# Patient Record
Sex: Female | Born: 1960
Health system: Southern US, Community
[De-identification: ages and names within clinical notes are randomized; demographics above are authoritative.]

## PROBLEM LIST (undated history)

## (undated) DIAGNOSIS — I10 Essential (primary) hypertension: Secondary | ICD-10-CM

## (undated) DIAGNOSIS — Z789 Other specified health status: Secondary | ICD-10-CM

## (undated) DIAGNOSIS — M21619 Bunion of unspecified foot: Secondary | ICD-10-CM

## (undated) HISTORY — PX: NO PAST SURGERIES: SHX2092

## (undated) HISTORY — PX: COLONOSCOPY: SHX174

---

## 1997-09-09 ENCOUNTER — Other Ambulatory Visit: Admission: RE | Admit: 1997-09-09 | Discharge: 1997-09-09 | Payer: Self-pay | Admitting: Obstetrics and Gynecology

## 1998-10-05 ENCOUNTER — Other Ambulatory Visit: Admission: RE | Admit: 1998-10-05 | Discharge: 1998-10-05 | Payer: Self-pay | Admitting: Obstetrics and Gynecology

## 1999-10-19 ENCOUNTER — Other Ambulatory Visit: Admission: RE | Admit: 1999-10-19 | Discharge: 1999-10-19 | Payer: Self-pay | Admitting: Obstetrics and Gynecology

## 2000-10-25 ENCOUNTER — Other Ambulatory Visit: Admission: RE | Admit: 2000-10-25 | Discharge: 2000-10-25 | Payer: Self-pay | Admitting: Obstetrics and Gynecology

## 2001-11-08 ENCOUNTER — Other Ambulatory Visit: Admission: RE | Admit: 2001-11-08 | Discharge: 2001-11-08 | Payer: Self-pay | Admitting: Obstetrics and Gynecology

## 2002-11-25 ENCOUNTER — Other Ambulatory Visit: Admission: RE | Admit: 2002-11-25 | Discharge: 2002-11-25 | Payer: Self-pay | Admitting: Obstetrics and Gynecology

## 2003-12-02 ENCOUNTER — Other Ambulatory Visit: Admission: RE | Admit: 2003-12-02 | Discharge: 2003-12-02 | Payer: Self-pay | Admitting: Obstetrics and Gynecology

## 2004-01-08 ENCOUNTER — Emergency Department (HOSPITAL_COMMUNITY): Admission: EM | Admit: 2004-01-08 | Discharge: 2004-01-08 | Payer: Self-pay | Admitting: Emergency Medicine

## 2005-01-17 ENCOUNTER — Other Ambulatory Visit: Admission: RE | Admit: 2005-01-17 | Discharge: 2005-01-17 | Payer: Self-pay | Admitting: Obstetrics and Gynecology

## 2011-03-04 ENCOUNTER — Encounter: Payer: Self-pay | Admitting: Internal Medicine

## 2011-03-22 ENCOUNTER — Encounter: Payer: Self-pay | Admitting: Internal Medicine

## 2011-03-22 ENCOUNTER — Ambulatory Visit (AMBULATORY_SURGERY_CENTER): Payer: BC Managed Care – PPO

## 2011-03-22 VITALS — Ht 62.0 in | Wt 146.8 lb

## 2011-03-22 DIAGNOSIS — Z1211 Encounter for screening for malignant neoplasm of colon: Secondary | ICD-10-CM

## 2011-03-22 MED ORDER — PEG-KCL-NACL-NASULF-NA ASC-C 100 G PO SOLR: 1.0000 | Freq: Once | ORAL | Status: AC

## 2011-03-22 MED ORDER — PEG-KCL-NACL-NASULF-NA ASC-C 100 G PO SOLR: 1.0000 | Freq: Once | ORAL | Status: DC

## 2011-04-04 ENCOUNTER — Other Ambulatory Visit: Payer: Self-pay | Admitting: Internal Medicine

## 2011-05-10 ENCOUNTER — Other Ambulatory Visit: Payer: Self-pay | Admitting: Internal Medicine

## 2011-05-16 ENCOUNTER — Other Ambulatory Visit: Payer: BC Managed Care – PPO | Admitting: Internal Medicine

## 2011-06-24 ENCOUNTER — Encounter: Payer: Self-pay | Admitting: Internal Medicine

## 2011-06-24 ENCOUNTER — Ambulatory Visit (AMBULATORY_SURGERY_CENTER): Payer: BC Managed Care – PPO | Admitting: *Deleted

## 2011-06-24 VITALS — Ht 62.0 in | Wt 141.0 lb

## 2011-06-24 DIAGNOSIS — Z1211 Encounter for screening for malignant neoplasm of colon: Secondary | ICD-10-CM

## 2011-06-24 MED ORDER — PEG-KCL-NACL-NASULF-NA ASC-C 100 G PO SOLR: ORAL | Status: DC

## 2011-07-08 ENCOUNTER — Other Ambulatory Visit: Payer: BC Managed Care – PPO | Admitting: Internal Medicine

## 2012-03-27 ENCOUNTER — Encounter: Payer: Self-pay | Admitting: Internal Medicine

## 2012-05-02 ENCOUNTER — Encounter: Payer: Self-pay | Admitting: Internal Medicine

## 2012-05-02 ENCOUNTER — Ambulatory Visit (AMBULATORY_SURGERY_CENTER): Payer: BC Managed Care – PPO

## 2012-05-02 VITALS — Ht 62.0 in | Wt 145.0 lb

## 2012-05-02 DIAGNOSIS — Z1211 Encounter for screening for malignant neoplasm of colon: Secondary | ICD-10-CM

## 2012-05-02 MED ORDER — NA SULFATE-K SULFATE-MG SULF 17.5-3.13-1.6 GM/177ML PO SOLN: 1.0000 | Freq: Once | ORAL | Status: DC

## 2012-05-15 ENCOUNTER — Telehealth: Payer: Self-pay | Admitting: *Deleted

## 2012-05-15 NOTE — Telephone Encounter (Signed)
Call made in error

## 2012-05-16 ENCOUNTER — Ambulatory Visit (AMBULATORY_SURGERY_CENTER): Payer: BC Managed Care – PPO | Admitting: Internal Medicine

## 2012-05-16 ENCOUNTER — Encounter: Payer: Self-pay | Admitting: Internal Medicine

## 2012-05-16 VITALS — BP 120/75 | HR 73 | Temp 98.5°F | Resp 20 | Ht 62.0 in | Wt 145.0 lb

## 2012-05-16 DIAGNOSIS — Z1211 Encounter for screening for malignant neoplasm of colon: Secondary | ICD-10-CM

## 2012-05-16 DIAGNOSIS — D126 Benign neoplasm of colon, unspecified: Secondary | ICD-10-CM

## 2012-05-16 MED ORDER — FLUTICASONE PROPIONATE 50 MCG/ACT NA SUSP: 2.0000 | NASAL | Status: DC | PRN

## 2012-05-16 MED ORDER — SODIUM CHLORIDE 0.9 % IV SOLN: 500.0000 mL | INTRAVENOUS | Status: DC

## 2012-05-16 NOTE — Op Note (Signed)
[facility] [address] [place], [address]   COLONOSCOPY PROCEDURE REPORT  PATIENT: Ashley Marks, Ashley Marks  MR#: [id number] BIRTHDATE: [birth year] , 51  yrs. old GENDER: Female ENDOSCOPIST: Iva Boop, MD, [facility] REFERRED XB:JYNW Waynard Edwards, M.D. PROCEDURE DATE:  05/16/2012 PROCEDURE:   Colonoscopy with biopsy ASA CLASS:   Class I INDICATIONS:average risk screening. MEDICATIONS: propofol (Diprivan) 300mg  IV, MAC sedation, administered by CRNA, and These medications were titrated to patient response per physician's verbal order  DESCRIPTION OF PROCEDURE:   After the risks benefits and alternatives of the procedure were thoroughly explained, informed consent was obtained.  A digital rectal exam revealed no abnormalities of the rectum.   The LB [id number]-[id number]  endoscope was introduced through the anus and advanced to the cecum, which was identified by both the appendix and ileocecal valve. No adverse events experienced.   The quality of the prep was Suprep excellent The instrument was then slowly withdrawn as the colon was fully examined.      COLON FINDINGS: A smooth sessile polyp measuring 2 mm in size was found in the transverse colon.  A polypectomy was performed with cold forceps.  The resection was complete and the polyp tissue was completely retrieved.   The colon mucosa was otherwise normal.   A right colon retroflexion was performed.  Retroflexed views revealed no abnormalities. The time to cecum=5 minutes 10 seconds. Withdrawal time=9 minutes 54 seconds.  The scope was withdrawn and the procedure completed. COMPLICATIONS: There were no complications.  ENDOSCOPIC IMPRESSION: 1.   Sessile polyp measuring 2 mm in size was found in the transverse colon; polypectomy was performed with cold forceps 2.   The colon mucosa was otherwise normal - excellent prep  RECOMMENDATIONS: Timing of repeat colonoscopy will be determined by  pathology findings.   eSigned:  Iva Boop, MD, [facility] 05/16/2012 11:14 AM   cc: Rodrigo Ran, MD and The Patient

## 2012-05-16 NOTE — Patient Instructions (Addendum)
One very tiny polyp was removed. It should be benign. Everything else looked fine.  I will let you know pathology results and when to have another routine colonoscopy by mail. Dr. Waynard Edwards will also get the results. Thank you for choosing me and [facility].  Iva Boop, MD, FACG   YOU HAD AN ENDOSCOPIC PROCEDURE TODAY AT THE  ENDOSCOPY CENTER: Refer to the procedure report that was given to you for any specific questions about what was found during the examination.  If the procedure report does not answer your questions, please call your gastroenterologist to clarify.  If you requested that your care partner not be given the details of your procedure findings, then the procedure report has been included in a sealed envelope for you to review at your convenience later.  YOU SHOULD EXPECT: Some feelings of bloating in the abdomen. Passage of more gas than usual.  Walking can help get rid of the air that was put into your GI tract during the procedure and reduce the bloating. If you had a lower endoscopy (such as a colonoscopy or flexible sigmoidoscopy) you may notice spotting of blood in your stool or on the toilet paper. If you underwent a bowel prep for your procedure, then you may not have a normal bowel movement for a few days.  DIET: Your first meal following the procedure should be a light meal and then it is ok to progress to your normal diet.  A half-sandwich or bowl of soup is an example of a good first meal.  Heavy or fried foods are harder to digest and may make you feel nauseous or bloated.  Likewise meals heavy in dairy and vegetables can cause extra gas to form and this can also increase the bloating.  Drink plenty of fluids but you should avoid alcoholic beverages for 24 hours.  ACTIVITY: Your care partner should take you home directly after the procedure.  You should plan to take it easy, moving slowly for the rest of the day.  You can resume normal activity the  day after the procedure however you should NOT DRIVE or use heavy machinery for 24 hours (because of the sedation medicines used during the test).    SYMPTOMS TO REPORT IMMEDIATELY: A gastroenterologist can be reached at any hour.  During normal business hours, 8:30 AM to 5:00 PM Monday through Friday, call [phone].  After hours and on weekends, please call the GI answering service at [phone] who will take a message and have the physician on call contact you.   Following lower endoscopy (colonoscopy or flexible sigmoidoscopy):  Excessive amounts of blood in the stool  Significant tenderness or worsening of abdominal pains  Swelling of the abdomen that is new, acute  Fever of 100F or higher  FOLLOW UP: If any biopsies were taken you will be contacted by phone or by letter within the next 1-3 weeks.  Call your gastroenterologist if you have not heard about the biopsies in 3 weeks.  Our staff will call the home number listed on your records the next business day following your procedure to check on you and address any questions or concerns that you may have at that time regarding the information given to you following your procedure. This is a courtesy call and so if there is no answer at the home number and we have not heard from you through the emergency physician on call, we will assume that you have returned to your regular  daily activities without incident.  SIGNATURES/CONFIDENTIALITY: You and/or your care partner have signed paperwork which will be entered into your electronic medical record.  These signatures attest to the fact that that the information above on your After Visit Summary has been reviewed and is understood.  Full responsibility of the confidentiality of this discharge information lies with you and/or your care-partner.  Polyp-handout given  Repeat colonoscopy will be determined by pathology

## 2012-05-16 NOTE — Progress Notes (Signed)
Patient did not experience any of the following events: a burn prior to discharge; a fall within the facility; wrong site/side/patient/procedure/implant event; or a hospital transfer or hospital admission upon discharge from the facility. (G8907) Patient did not have preoperative order for IV antibiotic SSI prophylaxis. (G8918)  

## 2012-05-17 ENCOUNTER — Telehealth: Payer: Self-pay

## 2012-05-17 NOTE — Telephone Encounter (Signed)
  Follow up Call-  Call back number 05/16/2012  Post procedure Call Back phone  # [phone]  Permission to leave phone message Yes     Patient questions:  Do you have a fever, pain , or abdominal swelling? no Pain Score  0 *  Have you tolerated food without any problems? yes  Have you been able to return to your normal activities? yes  Do you have any questions about your discharge instructions: Diet   no Medications  no Follow up visit  no  Do you have questions or concerns about your Care? no  Actions: * If pain score is 4 or above: No action needed, pain <4.

## 2012-05-23 ENCOUNTER — Encounter: Payer: Self-pay | Admitting: Internal Medicine

## 2014-05-29 ENCOUNTER — Other Ambulatory Visit: Payer: Self-pay | Admitting: Obstetrics and Gynecology

## 2014-06-02 LAB — CYTOLOGY - PAP

## 2015-06-19 ENCOUNTER — Other Ambulatory Visit: Payer: Self-pay | Admitting: Obstetrics and Gynecology

## 2015-06-19 DIAGNOSIS — R928 Other abnormal and inconclusive findings on diagnostic imaging of breast: Secondary | ICD-10-CM

## 2015-06-29 ENCOUNTER — Ambulatory Visit
Admission: RE | Admit: 2015-06-29 | Discharge: 2015-06-29 | Disposition: A | Discharge status: Home / Self Care | Payer: BLUE CROSS/BLUE SHIELD | Source: Ambulatory Visit | Attending: Obstetrics and Gynecology | Admitting: Obstetrics and Gynecology

## 2015-06-29 ENCOUNTER — Other Ambulatory Visit: Payer: Self-pay | Admitting: Obstetrics and Gynecology

## 2015-06-29 DIAGNOSIS — R921 Mammographic calcification found on diagnostic imaging of breast: Secondary | ICD-10-CM

## 2015-06-29 DIAGNOSIS — R928 Other abnormal and inconclusive findings on diagnostic imaging of breast: Secondary | ICD-10-CM

## 2015-07-03 ENCOUNTER — Ambulatory Visit
Admission: RE | Admit: 2015-07-03 | Discharge: 2015-07-03 | Disposition: A | Discharge status: Home / Self Care | Payer: BLUE CROSS/BLUE SHIELD | Source: Ambulatory Visit | Attending: Obstetrics and Gynecology | Admitting: Obstetrics and Gynecology

## 2015-07-03 ENCOUNTER — Other Ambulatory Visit: Payer: Self-pay | Admitting: Obstetrics and Gynecology

## 2015-07-03 DIAGNOSIS — R921 Mammographic calcification found on diagnostic imaging of breast: Secondary | ICD-10-CM

## 2015-07-16 ENCOUNTER — Other Ambulatory Visit: Payer: Self-pay | Admitting: General Surgery

## 2015-07-16 DIAGNOSIS — R928 Other abnormal and inconclusive findings on diagnostic imaging of breast: Secondary | ICD-10-CM

## 2015-07-28 ENCOUNTER — Other Ambulatory Visit: Payer: Self-pay | Admitting: General Surgery

## 2015-07-28 DIAGNOSIS — R928 Other abnormal and inconclusive findings on diagnostic imaging of breast: Secondary | ICD-10-CM

## 2015-07-29 ENCOUNTER — Encounter (HOSPITAL_BASED_OUTPATIENT_CLINIC_OR_DEPARTMENT_OTHER): Payer: Self-pay | Admitting: *Deleted

## 2015-08-05 ENCOUNTER — Ambulatory Visit
Admission: RE | Admit: 2015-08-05 | Discharge: 2015-08-05 | Disposition: A | Discharge status: Home / Self Care | Payer: BLUE CROSS/BLUE SHIELD | Source: Ambulatory Visit | Attending: General Surgery | Admitting: General Surgery

## 2015-08-05 DIAGNOSIS — R928 Other abnormal and inconclusive findings on diagnostic imaging of breast: Secondary | ICD-10-CM

## 2015-08-05 NOTE — Progress Notes (Signed)
Pt picked up boost drink and gave pt instructions/ verbalized understanding.

## 2015-08-06 ENCOUNTER — Encounter (HOSPITAL_BASED_OUTPATIENT_CLINIC_OR_DEPARTMENT_OTHER)
Admission: RE | Disposition: A | Discharge status: Home / Self Care | Payer: Self-pay | Source: Ambulatory Visit | Attending: General Surgery

## 2015-08-06 ENCOUNTER — Ambulatory Visit (HOSPITAL_BASED_OUTPATIENT_CLINIC_OR_DEPARTMENT_OTHER)
Admission: RE | Admit: 2015-08-06 | Discharge: 2015-08-06 | Disposition: A | Discharge status: Home / Self Care | Payer: BLUE CROSS/BLUE SHIELD | Source: Ambulatory Visit | Attending: General Surgery | Admitting: General Surgery

## 2015-08-06 ENCOUNTER — Ambulatory Visit (HOSPITAL_BASED_OUTPATIENT_CLINIC_OR_DEPARTMENT_OTHER): Payer: BLUE CROSS/BLUE SHIELD | Admitting: Anesthesiology

## 2015-08-06 ENCOUNTER — Encounter (HOSPITAL_BASED_OUTPATIENT_CLINIC_OR_DEPARTMENT_OTHER): Payer: Self-pay

## 2015-08-06 ENCOUNTER — Ambulatory Visit
Admission: RE | Admit: 2015-08-06 | Discharge: 2015-08-06 | Disposition: A | Discharge status: Home / Self Care | Payer: BLUE CROSS/BLUE SHIELD | Source: Ambulatory Visit | Attending: General Surgery | Admitting: General Surgery

## 2015-08-06 DIAGNOSIS — N62 Hypertrophy of breast: Secondary | ICD-10-CM | POA: Insufficient documentation

## 2015-08-06 DIAGNOSIS — Z87891 Personal history of nicotine dependence: Secondary | ICD-10-CM | POA: Diagnosis not present

## 2015-08-06 DIAGNOSIS — R928 Other abnormal and inconclusive findings on diagnostic imaging of breast: Secondary | ICD-10-CM

## 2015-08-06 HISTORY — PX: RADIOACTIVE SEED GUIDED EXCISIONAL BREAST BIOPSY: SHX6490

## 2015-08-06 HISTORY — DX: Other specified health status: Z78.9

## 2015-08-06 SURGERY — RADIOACTIVE SEED GUIDED BREAST BIOPSY
Anesthesia: General | Site: Breast | Laterality: Right

## 2015-08-06 MED ORDER — OXYCODONE-ACETAMINOPHEN 5-325 MG PO TABS: 1.0000 | ORAL_TABLET | ORAL | Status: DC | PRN

## 2015-08-06 MED ORDER — LACTATED RINGERS IV SOLN
INTRAVENOUS | Status: DC
  Administered 2015-08-06 (×2): via INTRAVENOUS

## 2015-08-06 MED ORDER — PROPOFOL 10 MG/ML IV BOLUS
INTRAVENOUS | Status: AC
  Filled 2015-08-06: qty 20

## 2015-08-06 MED ORDER — OXYCODONE HCL 5 MG PO TABS: 5.0000 mg | ORAL_TABLET | Freq: Once | ORAL | Status: DC | PRN

## 2015-08-06 MED ORDER — ATROPINE SULFATE 1 MG/ML IJ SOLN
INTRAMUSCULAR | Status: AC
  Filled 2015-08-06: qty 1

## 2015-08-06 MED ORDER — ONDANSETRON HCL 4 MG/2ML IJ SOLN
INTRAMUSCULAR | Status: DC | PRN
  Administered 2015-08-06: 4 mg via INTRAVENOUS

## 2015-08-06 MED ORDER — FENTANYL CITRATE (PF) 100 MCG/2ML IJ SOLN
50.0000 ug | INTRAMUSCULAR | Status: DC | PRN
  Administered 2015-08-06: 100 ug via INTRAVENOUS

## 2015-08-06 MED ORDER — BUPIVACAINE-EPINEPHRINE 0.5% -1:200000 IJ SOLN
INTRAMUSCULAR | Status: DC | PRN
  Administered 2015-08-06: 20 mL

## 2015-08-06 MED ORDER — SCOPOLAMINE 1 MG/3DAYS TD PT72: 1.0000 | MEDICATED_PATCH | Freq: Once | TRANSDERMAL | Status: DC | PRN

## 2015-08-06 MED ORDER — MIDAZOLAM HCL 2 MG/2ML IJ SOLN
INTRAMUSCULAR | Status: AC
  Filled 2015-08-06: qty 2

## 2015-08-06 MED ORDER — EPHEDRINE 5 MG/ML INJ
INTRAVENOUS | Status: AC
  Filled 2015-08-06: qty 10

## 2015-08-06 MED ORDER — ONDANSETRON HCL 4 MG/2ML IJ SOLN
INTRAMUSCULAR | Status: AC
  Filled 2015-08-06: qty 2

## 2015-08-06 MED ORDER — OXYCODONE HCL 5 MG/5ML PO SOLN: 5.0000 mg | Freq: Once | ORAL | Status: DC | PRN

## 2015-08-06 MED ORDER — FENTANYL CITRATE (PF) 100 MCG/2ML IJ SOLN: 25.0000 ug | INTRAMUSCULAR | Status: DC | PRN

## 2015-08-06 MED ORDER — FENTANYL CITRATE (PF) 100 MCG/2ML IJ SOLN
INTRAMUSCULAR | Status: AC
  Filled 2015-08-06: qty 2

## 2015-08-06 MED ORDER — CEFAZOLIN SODIUM-DEXTROSE 2-4 GM/100ML-% IV SOLN
2.0000 g | INTRAVENOUS | Status: AC
  Administered 2015-08-06: 2 g via INTRAVENOUS

## 2015-08-06 MED ORDER — LIDOCAINE 2% (20 MG/ML) 5 ML SYRINGE
INTRAMUSCULAR | Status: AC
  Filled 2015-08-06: qty 5

## 2015-08-06 MED ORDER — DEXAMETHASONE SODIUM PHOSPHATE 10 MG/ML IJ SOLN
INTRAMUSCULAR | Status: AC
  Filled 2015-08-06: qty 1

## 2015-08-06 MED ORDER — MIDAZOLAM HCL 2 MG/2ML IJ SOLN
1.0000 mg | INTRAMUSCULAR | Status: DC | PRN
  Administered 2015-08-06: 2 mg via INTRAVENOUS

## 2015-08-06 MED ORDER — ONDANSETRON HCL 4 MG/2ML IJ SOLN: 4.0000 mg | Freq: Four times a day (QID) | INTRAMUSCULAR | Status: DC | PRN

## 2015-08-06 MED ORDER — DEXAMETHASONE SODIUM PHOSPHATE 4 MG/ML IJ SOLN
INTRAMUSCULAR | Status: DC | PRN
  Administered 2015-08-06: 10 mg via INTRAVENOUS

## 2015-08-06 MED ORDER — GLYCOPYRROLATE 0.2 MG/ML IJ SOLN: 0.2000 mg | Freq: Once | INTRAMUSCULAR | Status: DC | PRN

## 2015-08-06 MED ORDER — CEFAZOLIN SODIUM-DEXTROSE 2-4 GM/100ML-% IV SOLN
INTRAVENOUS | Status: AC
  Filled 2015-08-06: qty 100

## 2015-08-06 MED ORDER — LIDOCAINE HCL (CARDIAC) 20 MG/ML IV SOLN
INTRAVENOUS | Status: DC | PRN
  Administered 2015-08-06: 30 mg via INTRAVENOUS

## 2015-08-06 MED ORDER — PHENYLEPHRINE 40 MCG/ML (10ML) SYRINGE FOR IV PUSH (FOR BLOOD PRESSURE SUPPORT)
PREFILLED_SYRINGE | INTRAVENOUS | Status: AC
  Filled 2015-08-06: qty 10

## 2015-08-06 MED ORDER — SUCCINYLCHOLINE CHLORIDE 200 MG/10ML IV SOSY
PREFILLED_SYRINGE | INTRAVENOUS | Status: AC
  Filled 2015-08-06: qty 10

## 2015-08-06 MED ORDER — PROPOFOL 10 MG/ML IV BOLUS
INTRAVENOUS | Status: DC | PRN
  Administered 2015-08-06: 250 mg via INTRAVENOUS

## 2015-08-06 SURGICAL SUPPLY — 54 items
ADH SKN CLS APL DERMABOND .7 (GAUZE/BANDAGES/DRESSINGS) ×1
BINDER BREAST LRG (GAUZE/BANDAGES/DRESSINGS) ×1 IMPLANT
BLADE SURG 10 STRL SS (BLADE) IMPLANT
BLADE SURG 15 STRL LF DISP TIS (BLADE) ×1 IMPLANT
BLADE SURG 15 STRL SS (BLADE) ×2
CANISTER SUC SOCK COL 7IN (MISCELLANEOUS) IMPLANT
CANISTER SUCT 1200ML W/VALVE (MISCELLANEOUS) ×2 IMPLANT
CHLORAPREP W/TINT 26ML (MISCELLANEOUS) ×2 IMPLANT
CLIP TI LARGE 6 (CLIP) IMPLANT
CLIP TI MEDIUM 6 (CLIP) ×1 IMPLANT
COVER BACK TABLE 60X90IN (DRAPES) ×2 IMPLANT
COVER MAYO STAND STRL (DRAPES) ×2 IMPLANT
COVER PROBE W GEL 5X96 (DRAPES) ×2 IMPLANT
DECANTER SPIKE VIAL GLASS SM (MISCELLANEOUS) IMPLANT
DERMABOND ADVANCED (GAUZE/BANDAGES/DRESSINGS) ×1
DERMABOND ADVANCED .7 DNX12 (GAUZE/BANDAGES/DRESSINGS) ×1 IMPLANT
DEVICE DUBIN W/COMP PLATE 8390 (MISCELLANEOUS) ×2 IMPLANT
DRAPE LAPAROSCOPIC ABDOMINAL (DRAPES) ×2 IMPLANT
DRAPE UTILITY XL STRL (DRAPES) ×2 IMPLANT
ELECT COATED BLADE 2.86 ST (ELECTRODE) ×2 IMPLANT
ELECT REM PT RETURN 9FT ADLT (ELECTROSURGICAL) ×2
ELECTRODE REM PT RTRN 9FT ADLT (ELECTROSURGICAL) ×1 IMPLANT
GLOVE BIO SURGEON STRL SZ 6 (GLOVE) ×2 IMPLANT
GLOVE BIOGEL PI IND STRL 6.5 (GLOVE) ×1 IMPLANT
GLOVE BIOGEL PI INDICATOR 6.5 (GLOVE) ×1
GLOVE SURG SS PI 7.0 STRL IVOR (GLOVE) ×1 IMPLANT
GOWN STRL REUS W/ TWL LRG LVL3 (GOWN DISPOSABLE) ×1 IMPLANT
GOWN STRL REUS W/TWL 2XL LVL3 (GOWN DISPOSABLE) ×2 IMPLANT
GOWN STRL REUS W/TWL LRG LVL3 (GOWN DISPOSABLE) ×2
ILLUMINATOR WAVEGUIDE N/F (MISCELLANEOUS) IMPLANT
KIT MARKER MARGIN INK (KITS) ×2 IMPLANT
LIGHT WAVEGUIDE WIDE FLAT (MISCELLANEOUS) ×1 IMPLANT
NDL HYPO 25X1 1.5 SAFETY (NEEDLE) ×1 IMPLANT
NEEDLE HYPO 25X1 1.5 SAFETY (NEEDLE) ×2 IMPLANT
NS IRRIG 1000ML POUR BTL (IV SOLUTION) ×2 IMPLANT
PACK BASIN DAY SURGERY FS (CUSTOM PROCEDURE TRAY) ×2 IMPLANT
PENCIL BUTTON HOLSTER BLD 10FT (ELECTRODE) ×2 IMPLANT
SLEEVE SCD COMPRESS KNEE MED (MISCELLANEOUS) ×2 IMPLANT
SPONGE GAUZE 4X4 12PLY STER LF (GAUZE/BANDAGES/DRESSINGS) ×2 IMPLANT
SPONGE LAP 18X18 X RAY DECT (DISPOSABLE) ×2 IMPLANT
STAPLER VISISTAT 35W (STAPLE) IMPLANT
STRIP CLOSURE SKIN 1/2X4 (GAUZE/BANDAGES/DRESSINGS) ×2 IMPLANT
SUT MON AB 4-0 PC3 18 (SUTURE) ×2 IMPLANT
SUT SILK 2 0 SH (SUTURE) IMPLANT
SUT VIC AB 2-0 SH 18 (SUTURE) IMPLANT
SUT VIC AB 3-0 SH 27 (SUTURE)
SUT VIC AB 3-0 SH 27X BRD (SUTURE) IMPLANT
SUT VICRYL 3-0 CR8 SH (SUTURE) ×2 IMPLANT
SYR BULB 3OZ (MISCELLANEOUS) ×2 IMPLANT
SYR CONTROL 10ML LL (SYRINGE) ×2 IMPLANT
TOWEL OR 17X24 6PK STRL BLUE (TOWEL DISPOSABLE) ×2 IMPLANT
TOWEL OR NON WOVEN STRL DISP B (DISPOSABLE) ×2 IMPLANT
TUBE CONNECTING 20X1/4 (TUBING) ×2 IMPLANT
YANKAUER SUCT BULB TIP NO VENT (SUCTIONS) ×2 IMPLANT

## 2015-08-06 NOTE — H&P (Signed)
Ashley Marks 07/16/2015 8:43 AM Location: [facility] Patient #: [id number] DOB: 03-Apr-1960 Married / Language: Ashley Marks / Race: White Female   History of Present Illness Ashley Klein MD; 07/17/2015 12:02 PM) The patient is a 55 year old female who presents with a complaint of Breast problems. Patient is a 55 year old female who presented with 5 mm of screening detected calcifications in the upper outer quadrant of the right breast. She was brought back for diagnostic imaging and subsequent biopsy. She was found to have atypical lobular hyperplasia on core needle biopsy. She is referred for consultation by Dr. Autumn Marks for excisional biopsy for discordant lesion.  The patient has not had a breast biopsy before. She has a grandmother who had breast cancer when she was older, but no other family members with breast cancer or other cancers. The patient doesn't have any personal history of cancers. She denies breast pain, nipple retraction, change in breast contour, nipple discharge.  Dx mammogram FINDINGS: Magnification views of the right breast demonstrate a 5 mm group of faint, predominantly punctate calcifications within the lateral right breast, middle depth. Many of these calcifications appear to have been present on the patient's 2012 mammogram although precise comparison cannot be performed due to absence of prior magnification views. No associated mass is identified.  Mammographic images were processed with CAD.  IMPRESSION: Probably benign right breast calcifications.  RECOMMENDATION: Options including short-term follow-up versus stereotactic guided biopsy were discussed with the patient. The patient wishes to pursue biopsy. She will be scheduled at her convenience.  Pathology Breast, right, needle core biopsy, upper outer quadrant - ATYPICAL LOBULAR HYPERPLASIA WITH CALCIFICATIONS. - FIBROCYSTIC CHANGES WITH COLUMNAR CELL HYPERPLASIA AND CALCIFICATIONS. - BENIGN  DUCTS WITH CALCIFICATIONS.   Diagnostic Studies History Ashley Marks, [name]; 07/16/2015 8:53 AM) Colonoscopy 1-5 years ago Mammogram within last year Pap Smear 1-5 years ago  Allergies Ashley Marks, CMA; 07/16/2015 8:53 AM) No Known Drug Allergies05/18/2017  Medication History Ashley Marks, CMA; 07/16/2015 8:53 AM) Ashley Marks (50MCG/ACT Suspension, Nasal) Active. Medications Reconciled  Social History Ashley Marks, [name]; 07/16/2015 8:53 AM) Alcohol use Moderate alcohol use. Caffeine use Tea. No drug use Tobacco use Former smoker.  Family History Ashley Marks, [name]; 07/16/2015 8:53 AM) Heart Disease Father. Heart disease in female family member before age 45  Pregnancy / Birth History Ashley Marks, [name]; 07/16/2015 8:53 AM) Age at menarche 58 years. Contraceptive History Oral contraceptives. Irregular periods Maternal age 11-30    Review of Systems Ashley Marks CMA; 07/16/2015 8:53 AM) General Not Present- Appetite Loss, Chills, Fatigue, Fever, Night Sweats, Weight Gain and Weight Loss. Skin Not Present- Change in Wart/Mole, Dryness, Hives, Jaundice, New Lesions, Non-Healing Wounds, Rash and Ulcer. HEENT Present- Wears glasses/contact lenses. Not Present- Earache, Hearing Loss, Hoarseness, Nose Bleed, Oral Ulcers, Ringing in the Ears, Seasonal Allergies, Sinus Pain, Sore Throat, Visual Disturbances and Yellow Eyes. Gastrointestinal Not Present- Abdominal Pain, Bloating, Bloody Stool, Change in Bowel Habits, Chronic diarrhea, Constipation, Difficulty Swallowing, Excessive gas, Gets full quickly at meals, Hemorrhoids, Indigestion, Nausea, Rectal Pain and Vomiting. Female Genitourinary Not Present- Frequency, Nocturia, Painful Urination, Pelvic Pain and Urgency. Musculoskeletal Not Present- Back Pain, Joint Pain, Joint Stiffness, Muscle Pain, Muscle Weakness and Swelling of Extremities. Neurological Not Present- Decreased Memory, Fainting, Headaches, Numbness, Seizures, Tingling,  Tremor, Trouble walking and Weakness. Psychiatric Not Present- Anxiety, Bipolar, Change in Sleep Pattern, Depression, Fearful and Frequent crying. Endocrine Not Present- Cold Intolerance, Excessive Hunger, Hair Changes, Heat Intolerance, Hot flashes and New Diabetes. Hematology Not Present-  Easy Bruising, Excessive bleeding, Gland problems, HIV and Persistent Infections.  Vitals Ashley Marks CMA; 07/16/2015 8:54 AM) 07/16/2015 8:53 AM Weight: 152 lb Height: 62in Body Surface Area: 1.7 m Body Mass Index: 27.8 kg/m  Temp.: 98.33F  Pulse: 84 (Regular)  BP: 126/74 (Sitting, Left Arm, Standard)       Physical Exam Ashley Klein MD; 07/17/2015 12:04 PM) General Mental Status-Alert. General Appearance-Consistent with stated age. Hydration-Well hydrated. Voice-Normal.  Head and Neck Head-normocephalic, atraumatic with no lesions or palpable masses. Trachea-midline. Thyroid Gland Characteristics - normal size and consistency.  Eye Eyeball - Bilateral-Extraocular movements intact. Sclera/Conjunctiva - Bilateral-No scleral icterus.  Chest and Lung Exam Chest and lung exam reveals -quiet, even and easy respiratory effort with no use of accessory muscles and on auscultation, normal breath sounds, no adventitious sounds and normal vocal resonance. Inspection Chest Wall - Normal. Back - normal.  Breast Note: breasts are symmetric bilaterally. Mild to moderate ptosis. No nipple retraction or skin dimpling. Minimal bruising from biopsy. no palpable masses. No lymphadenopathy. No nipple discharge   Cardiovascular Cardiovascular examination reveals -normal heart sounds, regular rate and rhythm with no murmurs and normal pedal pulses bilaterally.  Abdomen Inspection Inspection of the abdomen reveals - No Hernias. Palpation/Percussion Palpation and Percussion of the abdomen reveal - Soft, Non Tender, No Rebound tenderness, No Rigidity (guarding) and No  hepatosplenomegaly. Auscultation Auscultation of the abdomen reveals - Bowel sounds normal.  Neurologic Neurologic evaluation reveals -alert and oriented x 3 with no impairment of recent or remote memory. Mental Status-Normal.  Musculoskeletal Global Assessment -Note: no gross deformities.  Normal Exam - Left-Upper Extremity Strength Normal and Lower Extremity Strength Normal. Normal Exam - Right-Upper Extremity Strength Normal and Lower Extremity Strength Normal.  Lymphatic Head & Neck  General Head & Neck Lymphatics: Bilateral - Description - Normal. Axillary  General Axillary Region: Bilateral - Description - Normal. Tenderness - Non Tender. Femoral & Inguinal  Generalized Femoral & Inguinal Lymphatics: Bilateral - Description - No Generalized lymphadenopathy.    Assessment & Plan Ashley Klein MD; 07/17/2015 12:05 PM) ABNORMAL MAMMOGRAM OF RIGHT BREAST (R92.8) Impression: The patient will need excisional biopsy. We will do this with seed localization. The surgical procedure was described to the patient. I discussed the incision type and location and that we would need radiology involved on with a wire or seed marker and/or sentinel node.  The risks and benefits of the procedure were described to the patient and she wishes to proceed.  We discussed the risks bleeding, infection, damage to other structures, need for further procedures/surgeries. We discussed the risk of seroma. The patient was advised if the area in the breast in cancer, we may need to go back to surgery for additional tissue to obtain negative margins or for a lymph node biopsy. The patient was advised that these are the most common complications, but that others can occur as well. They were advised against taking aspirin or other anti-inflammatory agents/blood thinners the week before surgery. 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 I discussed she would not be able to do lifting for  a week.  She understands and wishes to proceed. Current Plans You are being scheduled for surgery - Our schedulers will call you.  You should hear from our office's scheduling department within 5 working days about the location, date, and time of surgery. We try to make accommodations for patient's preferences in scheduling surgery, but sometimes the OR schedule or the surgeon's schedule prevents [place]  from making those accommodations.  If you have not heard from our office [phone]) in 5 working days, call the office and ask for your surgeon's nurse.  If you have other questions about your diagnosis, plan, or surgery, call the office and ask for your surgeon's nurse.  Pt Education - [facility] Breast Biopsy HCI: discussed with patient and provided information.   Signed by Ashley Klein, MD (07/17/2015 12:05 PM)

## 2015-08-06 NOTE — Interval H&P Note (Signed)
History and Physical Interval Note:  08/06/2015 7:54 AM  Ashley Marks  has presented today for surgery, with the diagnosis of Right breast abnormal mammorgram   The various methods of treatment have been discussed with the patient and family. After consideration of risks, benefits and other options for treatment, the patient has consented to  Procedure(s): RADIOACTIVE SEED GUIDED EXCISIONAL RIGHT BREAST BIOPSY (Right) as a surgical intervention .  The patient's history has been reviewed, patient examined, no change in status, stable for surgery.  I have reviewed the patient's chart and labs.  Questions were answered to the patient's satisfaction.     [name]

## 2015-08-06 NOTE — Anesthesia Preprocedure Evaluation (Signed)
Anesthesia Evaluation  Patient identified by MRN, date of birth, ID band Patient awake    Reviewed: Allergy & Precautions, NPO status , Patient's Chart, lab work & pertinent test results  Airway Mallampati: II   Neck ROM: full    Dental   Pulmonary neg pulmonary ROS,    breath sounds clear to auscultation       Cardiovascular negative cardio ROS   Rhythm:regular Rate:Normal     Neuro/Psych    GI/Hepatic   Endo/Other    Renal/GU      Musculoskeletal   Abdominal   Peds  Hematology   Anesthesia Other Findings   Reproductive/Obstetrics                             Anesthesia Physical Anesthesia Plan  ASA: I  Anesthesia Plan: General   Post-op Pain Management:    Induction: Intravenous  Airway Management Planned: LMA  Additional Equipment:   Intra-op Plan:   Post-operative Plan:   Informed Consent: I have reviewed the patients History and Physical, chart, labs and discussed the procedure including the risks, benefits and alternatives for the proposed anesthesia with the patient or authorized representative who has indicated his/her understanding and acceptance.     Plan Discussed with: CRNA, Anesthesiologist and Surgeon  Anesthesia Plan Comments:         Anesthesia Quick Evaluation

## 2015-08-06 NOTE — Op Note (Signed)
Right Breast Radioactive seed localized excisional biopsy  Indications: This patient presents with history of abnormal mammogram and discordant biopsy  Pre-operative Diagnosis: see above  Post-operative Diagnosis: see above  Surgeon: [name]   Anesthesia: General endotracheal anesthesia  ASA Class: 1  Procedure Details  The patient was seen in the Holding Room. The risks, benefits, complications, treatment options, and expected outcomes were discussed with the patient. The possibilities of bleeding, infection, the need for additional procedures, failure to diagnose a condition, and creating a complication requiring transfusion or operation were discussed with the patient. The patient concurred with the proposed plan, giving informed consent.  The site of surgery properly noted/marked. The patient was taken to Operating Room # 1, identified, and the procedure verified as Right Breast seed localized excisional biopsy. A Time Out was held and the above information confirmed.  The left breast and chest were prepped and draped in standard fashion. The lumpectomy was performed by creating an circumareolar incision on the lateral breast over the previously placed radioactive seed.  Dissection was carried down to around the point of maximum signal intensity. The cautery was used to perform the dissection.  Hemostasis was achieved with cautery. The cavity was marked with 2 clips.   The specimen was inked with the margin marker paint kit.    Specimen radiography confirmed inclusion of the mammographic lesion, the clip, and the seed.  The background signal in the breast was zero.  The wound was irrigated and closed with 3-0 vicryl in layers and 4-0 monocryl subcuticular suture.      Sterile dressings were applied. At the end of the operation, all sponge, instrument, and needle counts were correct.  Findings: grossly clear surgical margins and no adenopathy  Estimated Blood Loss:  min          Specimens: right breast excisional biopsy         Complications:  None; patient tolerated the procedure well.         Disposition: PACU - hemodynamically stable.         Condition: stable

## 2015-08-06 NOTE — Anesthesia Postprocedure Evaluation (Signed)
Anesthesia Post Note  Patient: Ashley Marks  Procedure(s) Performed: Procedure(s) (LRB): RADIOACTIVE SEED GUIDED EXCISIONAL RIGHT BREAST BIOPSY (Right)  Patient location during evaluation: PACU Anesthesia Type: General Level of consciousness: awake and alert and patient cooperative Pain management: pain level controlled Vital Signs Assessment: post-procedure vital signs reviewed and stable Respiratory status: spontaneous breathing and respiratory function stable Cardiovascular status: stable Anesthetic complications: no    Last Vitals:  Filed Vitals:   08/06/15 0915 08/06/15 0928  BP: 124/76 137/85  Pulse: 77 84  Temp:    Resp: 13 16    Last Pain: There were no vitals filed for this visit.               [place]

## 2015-08-06 NOTE — Discharge Instructions (Addendum)
[facility] °Office Phone Number [phone] ° °BREAST BIOPSY/ PARTIAL MASTECTOMY: POST OP INSTRUCTIONS ° °Always review your discharge instruction sheet given to you by the facility where your surgery was performed. ° °IF YOU HAVE DISABILITY OR FAMILY LEAVE FORMS, YOU MUST BRING THEM TO THE OFFICE FOR PROCESSING.  DO NOT GIVE THEM TO YOUR DOCTOR. ° °1. A prescription for pain medication may be given to you upon discharge.  Take your pain medication as prescribed, if needed.  If narcotic pain medicine is not needed, then you may take acetaminophen (Tylenol) or ibuprofen (Advil) as needed. °2. Take your usually prescribed medications unless otherwise directed °3. If you need a refill on your pain medication, please contact your pharmacy.  They will contact our office to request authorization.  Prescriptions will not be filled after 5pm or on week-ends. °4. You should eat very light the first 24 hours after surgery, such as soup, crackers, pudding, etc.  Resume your normal diet the day after surgery. °5. Most patients will experience some swelling and bruising in the breast.  Ice packs and a good support bra will help.  Swelling and bruising can take several days to resolve.  °6. It is common to experience some constipation if taking pain medication after surgery.  Increasing fluid intake and taking a stool softener will usually help or prevent this problem from occurring.  A mild laxative (Milk of Magnesia or Miralax) should be taken according to package directions if there are no bowel movements after 48 hours. °7. Unless discharge instructions indicate otherwise, you may remove your bandages 48 hours after surgery, and you may shower at that time.  You may have steri-strips (small skin tapes) in place directly over the incision.  These strips should be left on the skin for 7-10 days.   Any sutures or staples will be removed at the office during your follow-up visit. °8. ACTIVITIES:  You may resume  regular daily activities (gradually increasing) beginning the next day.  Wearing a good support bra or sports bra (or the breast binder) minimizes pain and swelling.  You may have sexual intercourse when it is comfortable. °a. You may drive when you no longer are taking prescription pain medication, you can comfortably wear a seatbelt, and you can safely maneuver your car and apply brakes. °b. RETURN TO WORK:  __________1 week_______________ °9. You should see your doctor in the office for a follow-up appointment approximately two weeks after your surgery.  Your doctor’s nurse will typically make your follow-up appointment when she calls you with your pathology report.  Expect your pathology report 2-3 business days after your surgery.  You may call to check if you do not hear from us after three days. ° ° °WHEN TO CALL YOUR DOCTOR: °1. Fever over 101.0 °2. Nausea and/or vomiting. °3. Extreme swelling or bruising. °4. Continued bleeding from incision. °5. Increased pain, redness, or drainage from the incision. ° °The clinic staff is available to answer your questions during regular business hours.  Please don’t hesitate to call and ask to speak to one of the nurses for clinical concerns.  If you have a medical emergency, go to the nearest emergency room or call 911.  A surgeon from [facility] is always on call at the hospital. ° °For further questions, please visit [email or web address]  ° ° °Post Anesthesia Home Care Instructions ° °Activity: °Get plenty of rest for the remainder of the day. A responsible adult should stay with you for 24   hours following the procedure.  °For the next 24 hours, DO NOT: °-Drive a car °-Operate machinery °-Drink alcoholic beverages °-Take any medication unless instructed by your physician °-Make any legal decisions or sign important papers. ° °Meals: °Start with liquid foods such as gelatin or soup. Progress to regular foods as tolerated. Avoid greasy, spicy, heavy  foods. If nausea and/or vomiting occur, drink only clear liquids until the nausea and/or vomiting subsides. Call your physician if vomiting continues. ° °Special Instructions/Symptoms: °Your throat may feel dry or sore from the anesthesia or the breathing tube placed in your throat during surgery. If this causes discomfort, gargle with warm salt water. The discomfort should disappear within 24 hours. ° °If you had a scopolamine patch placed behind your ear for the management of post- operative nausea and/or vomiting: ° °1. The medication in the patch is effective for 72 hours, after which it should be removed.  Wrap patch in a tissue and discard in the trash. Wash hands thoroughly with soap and water. °2. You may remove the patch earlier than 72 hours if you experience unpleasant side effects which may include dry mouth, dizziness or visual disturbances. °3. Avoid touching the patch. Wash your hands with soap and water after contact with the patch. °  ° °

## 2015-08-06 NOTE — Transfer of Care (Signed)
Immediate Anesthesia Transfer of Care Note  Patient: Ashley Marks  Procedure(s) Performed: Procedure(s): RADIOACTIVE SEED GUIDED EXCISIONAL RIGHT BREAST BIOPSY (Right)  Patient Location: PACU  Anesthesia Type:General  Level of Consciousness: awake, alert  and oriented  Airway & Oxygen Therapy: Patient Spontanous Breathing and Patient connected to face mask oxygen  Post-op Assessment: Report given to RN and Post -op Vital signs reviewed and stable  Post vital signs: Reviewed and stable  Last Vitals:  Filed Vitals:   08/06/15 0648  BP: 130/92  Pulse: 89  Temp: 36.8 C  Resp: 20    Last Pain: There were no vitals filed for this visit.       Complications: No apparent anesthesia complications

## 2015-08-06 NOTE — Anesthesia Procedure Notes (Signed)
Procedure Name: LMA Insertion Date/Time: 08/06/2015 8:08 AM Performed by: Melynda Ripple D Pre-anesthesia Checklist: Patient identified, Emergency Drugs available, Suction available and Patient being monitored Patient Re-evaluated:Patient Re-evaluated prior to inductionOxygen Delivery Method: Circle system utilized Preoxygenation: Pre-oxygenation with 100% oxygen Intubation Type: IV induction Ventilation: Mask ventilation without difficulty LMA: LMA inserted LMA Size: 4.0 Number of attempts: 1 Airway Equipment and Method: Bite block Placement Confirmation: positive ETCO2 Tube secured with: Tape Dental Injury: Teeth and Oropharynx as per pre-operative assessment

## 2015-08-07 ENCOUNTER — Encounter (HOSPITAL_BASED_OUTPATIENT_CLINIC_OR_DEPARTMENT_OTHER): Payer: Self-pay | Admitting: General Surgery

## 2015-08-10 NOTE — Progress Notes (Signed)
Quick Note:  Please let patient know path is benign. ______

## 2015-08-31 ENCOUNTER — Encounter: Payer: Self-pay | Admitting: Oncology

## 2015-08-31 ENCOUNTER — Telehealth: Payer: Self-pay | Admitting: Oncology

## 2015-08-31 NOTE — Telephone Encounter (Signed)
Appointment with Magrinat on 7/17 at 4:30. Aware to arrive 30 minutes early. Demographics verified. Location given. Letter to referring and patient.

## 2015-09-03 ENCOUNTER — Telehealth: Payer: Self-pay | Admitting: *Deleted

## 2015-09-03 NOTE — Telephone Encounter (Signed)
Received appt date/time from [name].  Placed a note for an intake form to be given to the pt at time of check in. 

## 2015-09-11 ENCOUNTER — Other Ambulatory Visit: Payer: Self-pay

## 2015-09-13 NOTE — Progress Notes (Signed)
[place]  [phone] [phone]     ID: Ashley Marks DOB: 1960/12/23  MR#: [id number]  [id number]  Marks Care Team: Crist Infante, MD as PCP - General (Internal Medicine) Molli Posey, MD as Consulting Physician (Obstetrics and Gynecology) [name], MD as Consulting Physician (General Surgery) Chauncey Cruel, MD as Consulting Physician (Oncology) OTHER MD:  CHIEF COMPLAINT: Atypical lobular hyperplasia  CURRENT TREATMENT: Tamoxifen  RESEARCH PROTOCOL: n/a  HISTORY of PRESENT ILLNESS: Ashley Marks had screening mammography at Dr. Delanna Ahmadi April 2017 showing some calcifications in Ashley upper outer quadrant of Ashley right breast. She was recalled for right diagnostic mammography at [facility] 06/29/2015. This showed some punctate calcifications measuring 0.5 cm in Ashley lateral right breast. Some of these had been present in Ashley Marks's prior mammogram from 2012. Biopsy of this area was performed 07/03/2015, and showed ([id number]) atypical lobular hyperplasia with calcifications. This was felt to be possibly discordant.  Ashley Marks was referred to surgery and after appropriate discussion proceeded to radioactive seed localized excisional biopsy 08/06/2015. Pathology from this procedure ([id number]) showed atypical lobular hyperplasia.  Her subsequent history is as detailed below  INTERVAL HISTORY: Ashley Marks was evaluated in Ashley breast clinic 09/14/2015  REVIEW OF SYSTEMS: There were no specific symptoms leading to Ashley original mammogram, which was routinely scheduled. Ashley Marks denies unusual headaches, visual changes, nausea, vomiting, stiff neck, dizziness, or gait imbalance. There has been no cough, phlegm production, or pleurisy, no chest pain or pressure, and no change in bowel or bladder habits. Ashley Marks denies fever, rash, bleeding, unexplained fatigue or unexplained weight loss. A detailed review of systems was otherwise  entirely negative.   No Known Allergies  Current Outpatient Prescriptions  Medication Sig Dispense Refill  . tamoxifen (NOLVADEX) 20 MG tablet Take 1 tablet (20 mg total) by mouth daily. 90 tablet 12   No current facility-administered medications for this visit.    PAST MEDICAL HISTORY: Past Medical History  Diagnosis Date  . Medical history non-contributory     PAST SURGICAL HISTORY: Past Surgical History  Procedure Laterality Date  . No past surgeries    . Radioactive seed guided excisional breast biopsy Right 08/06/2015    Procedure: RADIOACTIVE SEED GUIDED EXCISIONAL RIGHT BREAST BIOPSY;  Surgeon: [name], MD;  Location: [facility];  Service: General;  Laterality: Right;    FAMILY HISTORY Family History  Problem Relation Age of Onset  . Heart disease Father   . Colon cancer Neg Hx   Ashley Marks's father died from heart disease at Ashley age of 34. Ashley Marks's mother is currently living, age 79 as of July 2017, with significant dementia. Ashley Marks had no brothers, 2 sisters. A maternal grandmother was diagnosed with breast cancer at age 56. A cousin on Ashley maternal side was diagnosed with breast cancer approximately age 26. There is no ovarian cancer history in Ashley family  GENETICS TESTING: meets criteria  GYNECOLOGIC HISTORY:  Menarche age 55, first live birth age 55, Ashley Marks is [place] P3. Her most recent period was 07/30/2015. Her periods are irregular and scant. She is not on hormone replacement. 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    ADVANCED DIRECTIVES: in place  HEALTH MAINTENANCE: Social History  Substance Use Topics  . Smoking status:  Never Smoker   . Smokeless tobacco: Never Used  . Alcohol Use: 1.0 oz/week    2 Standard drinks or equivalent per week     Colonoscopy: 2014/ [facility]  PAP: March 2017  Bone density:  Lipid panel:    OBJECTIVE: Middle-aged white woman who appears stated age 55 Vitals:   09/14/15 1618  BP: 161/94  Pulse: 86  Temp: 98.4 F (36.9 C)  Resp: 19     Body mass index is 28.29 kg/(m^2).    ECOG FS:0 - Asymptomatic  Ocular: Sclerae unicteric, pupils equal, round and reactive to light Ear-nose-throat: Oropharynx clear, dentition fair Lymphatic: No cervical or supraclavicular adenopathy Lungs no rales or rhonchi, good excursion bilaterally Heart regular rate and rhythm, no murmur appreciated Abd soft, nontender, positive bowel sounds MSK no focal spinal tenderness, no joint edema Neuro: non-focal, well-oriented, appropriate affect Breasts: Ashley right breast is status post excisional biopsy. Ashley cosmetic result is excellent. There is minimal swelling, no erythema, no tenderness. Ashley right axilla is benign. Ashley left breast is unremarkable.   LAB RESULTS:  CMP  No results found for: NA, K, CL, CO2, GLUCOSE, BUN, CREATININE, CALCIUM, PROT, ALBUMIN, AST, ALT, ALKPHOS, BILITOT, GFRNONAA, GFRAA  No results found for: KPAFRELGTCHN, LAMBDASER, KAPLAMBRATIO  No results found for: TOTALPROTELP, ALBUMINELP, A1GS, A2GS, BETS, BETA2SER, GAMS, MSPIKE, SPEI  No results found for: WBC, NEUTROABS, HGB, HCT, MCV, PLT    Chemistry   No results found for: NA, K, CL, CO2, BUN, CREATININE, GLU No results found for: CALCIUM, ALKPHOS, AST, ALT, BILITOT     No results found for: LABCA2  No components found for: [id number]  No results for input(s): INR in Ashley last 168 hours.  Urinalysis No results found for: COLORURINE, APPEARANCEUR, LABSPEC, PHURINE, GLUCOSEU, HGBUR, BILIRUBINUR, KETONESUR, PROTEINUR, UROBILINOGEN, NITRITE, LEUKOCYTESUR  RADIOLOGY AND OTHER STUDIES: CLINICAL DATA: Callback for  possible right breast calcifications  EXAM: DIGITAL DIAGNOSTIC RIGHT MAMMOGRAM WITH CAD  COMPARISON: Previous exam(s).  ACR Breast Density Category b: There are scattered areas of fibroglandular density.  FINDINGS: Magnification views of Ashley right breast demonstrate a 5 mm group of faint, predominantly punctate calcifications within Ashley lateral right breast, middle depth. Many of these calcifications appear to have been present on Ashley Marks's 2012 mammogram although precise comparison cannot be performed due to absence of prior magnification views. No associated mass is identified.  Mammographic images were processed with CAD.  IMPRESSION: Probably benign right breast calcifications.  RECOMMENDATION: Options including short-term follow-up versus stereotactic guided biopsy were discussed with Ashley Marks. Ashley Marks wishes to pursue biopsy. She will be scheduled at her convenience.  I have discussed Ashley findings and recommendations with Ashley Marks. Results were also provided in writing at Ashley conclusion of Ashley visit. If applicable, a reminder letter will be sent to Ashley Marks regarding Ashley next appointment.  BI-RADS CATEGORY 3: Probably benign finding(s) - short interval follow-up suggested.   Electronically Signed  By: Pamelia Hoit M.D.  On: 06/29/2015 11:28    ASSESSMENT: 55 y.o. [place] woman status post right breast lumpectomy showing atypical lobular hyperplasia 08/06/2015  (1) tamoxifen started 09/14/2015  (2) genetics testing to be discussed     PLAN:  I spent approxmately one hour today with Ashley Marks making sure she understood Ashley biology of her situation, Ashley risks involved and what she can do to reduce those risks.  It  is important to emphasize hat she does not have breast cancer. When she has is a marker of increased breast cancer risk. We discussed Ashley meaning of Ashley words atypical, hyerplasia, lobular, and she understans hat Ashley  difference between atypical lobular hyperplasia and lobular carcinoma in situ. Ashley latter is considered a cancer (stage 0). Ashley difference between atypical lobular hyperplasia and lobular carcinoma in situ is 10 extent. In her case because Ashley lesion was so small she does not carry a diagnosis of cancer.  Ashley increased breast cancer risk associated with atypical lobular hyperplasia involves both breasts. That risk according to one study of 184 cases was 6% at 4 years --.Ann Surg Oncol. 2012 Oct;19(10):3131-8. However taking account of other variables including her age at menarche, her age at first live birth, and Ashley fact that she does not have a first-degree relative with breast cancer, I would calculate Ashley five-year risk of her developing breast cancer to be closer to 3%, and her lifetime risk to be 20%.  We discussed Ashley various available way 7 decreasing this risk. One is bilateral mastectomies. This is generally discouraged in this situation. A second possibility would be yearly MRI, which does not actually decrease Ashley risk but does increase Ashley detection rate. This is more controversial. Ashley problem with "too much" sensitivity is that false positives are detected which require further evaluation, meaning further biopsies and further images.  In Ashley Marks's case, since she has breast density category B, mammography is already sensitive and with tomography more so. Accordingly what I would recommend in terms of screening is yearly mammography with tomography. She is agreeable to this.  Ashley risk reduction strategy that is generally considered in these cases is anti-estrogens. There are 3 choices, tamoxifen, raloxifene, and aromatase inhibitors. Because she is still menstruating even if irregularly, she is not a candidate for aromatase inhibitors. I generally do not use raloxifene in Ashley setting because it offers no real advantage over tamoxifen. There may be marginally fewer side effects but there is also  marginally less benefit. It is also more costly.  Accordingly we discussed tamoxifen in detail and she is well informed regarding Ashley possible toxicities, side effects and complications of this agent. After much thought she decided she would like to give it a try. If she tolerates it well she will take it for 5 years. If not she will stop and either consider raloxifene or forego risk reduction  She is going to see me again in about 3 months just to make sure she is tolerating tamoxifen well. If she has I will probably see her on a once a year basis alternating with her primary care physician until she completes her 5 years of follow-up.  Incidentally I did not realize that she will qualify for genetic testing until after she had already left. We will discuss this further at her next visit.  Chauncey Cruel, MD   09/14/2015 5:50 PM Medical Oncology and Hematology [facility] [address] [place], [place] [address] Tel. [phone]    Fax. [phone]

## 2015-09-14 ENCOUNTER — Ambulatory Visit (HOSPITAL_BASED_OUTPATIENT_CLINIC_OR_DEPARTMENT_OTHER): Payer: BLUE CROSS/BLUE SHIELD | Admitting: Oncology

## 2015-09-14 ENCOUNTER — Other Ambulatory Visit: Payer: BLUE CROSS/BLUE SHIELD

## 2015-09-14 VITALS — BP 161/94 | HR 86 | Temp 98.4°F | Resp 19 | Ht 62.0 in | Wt 154.7 lb

## 2015-09-14 DIAGNOSIS — N62 Hypertrophy of breast: Secondary | ICD-10-CM | POA: Diagnosis not present

## 2015-09-14 DIAGNOSIS — N6091 Unspecified benign mammary dysplasia of right breast: Secondary | ICD-10-CM | POA: Insufficient documentation

## 2015-09-14 MED ORDER — TAMOXIFEN CITRATE 20 MG PO TABS: 20.0000 mg | ORAL_TABLET | Freq: Every day | ORAL | Status: AC

## 2015-12-17 ENCOUNTER — Ambulatory Visit (INDEPENDENT_AMBULATORY_CARE_PROVIDER_SITE_OTHER): Payer: BLUE CROSS/BLUE SHIELD | Admitting: Podiatry

## 2015-12-17 ENCOUNTER — Ambulatory Visit: Payer: BLUE CROSS/BLUE SHIELD

## 2015-12-17 ENCOUNTER — Ambulatory Visit (INDEPENDENT_AMBULATORY_CARE_PROVIDER_SITE_OTHER): Payer: BLUE CROSS/BLUE SHIELD

## 2015-12-17 ENCOUNTER — Encounter: Payer: Self-pay | Admitting: Podiatry

## 2015-12-17 DIAGNOSIS — M779 Enthesopathy, unspecified: Secondary | ICD-10-CM

## 2015-12-17 DIAGNOSIS — M2010 Hallux valgus (acquired), unspecified foot: Secondary | ICD-10-CM

## 2015-12-17 DIAGNOSIS — M778 Other enthesopathies, not elsewhere classified: Secondary | ICD-10-CM

## 2015-12-17 DIAGNOSIS — M7751 Other enthesopathy of right foot: Secondary | ICD-10-CM | POA: Diagnosis not present

## 2015-12-17 DIAGNOSIS — M2012 Hallux valgus (acquired), left foot: Secondary | ICD-10-CM | POA: Diagnosis not present

## 2015-12-17 MED ORDER — DICLOFENAC SODIUM 1 % TD GEL: 4.0000 g | Freq: Four times a day (QID) | TRANSDERMAL | 2 refills | Status: DC

## 2015-12-17 NOTE — Progress Notes (Signed)
   Subjective:    Patient ID: Ashley Marks, female    DOB: August 08, 1960, 55 y.o.   MRN: [id number]  HPI: She presents today with a chief complaint of pain to the first metatarsophalangeal joint of the left foot she states is mild to moderate on occasions. States that it aches some spent doing this for the past several months turns red and swells and is uncomfortable with some shoe gear. She is also complaining about general pain beneath the second third fourth metatarsals bilaterally as well as a states this is just been going on for the summer. She does relate that she's been wearing flip-flops and sandals and bare feet a lot this summer.    Review of Systems  All other systems reviewed and are negative.      Objective:   Physical Exam vital signs are stable alert and oriented 3 no acute distress pleasant personal. Pulses are strongly palpable. Neurologic sensorium is intact. He can reflex are intact muscle strength +5 over 5 dorsiflexion plantar flexors and inverters everters all intrinsic musculature is intact. Orthopedic evaluation was discussed ankle range of motion without crepitation. She does have mild hallux valgus deformity with some crepitation and limitation range of motion of the first metatarsophalangeal joint of the left foot she also has pain on palpation and range of motion of the second metatarsophalangeal joint of the left foot. She has no reproducible pain on palpation of the lesser metatarsophalangeal joints bilaterally.  Bilateral radiographs demonstrate rectus foot type right without any signs of complications. That she does have elongated second third metatarsals. Left foot does demonstrate very early osteoarthritic changes with some dorsal spurring at the level of the first metatarsophalangeal joint and what appears to be some dorsal spurring at the second metatarsophalangeal joint dorsally on lateral views. Otherwise no fractures. Increase in the first intermetatarsal angle  is prominent with hypertrophic medial condyle and increase in the first intermetatarsal angle. Cutaneous evaluation demonstrates supple well-hydrated cutis.        Assessment & Plan:  Hallux abductovalgus deformity left foot hallux limitus left foot capsulitis with osteoarthritic changes second metatarsal plantar joint left foot metatarsalgia bilateral foot.  Plan: She is scanned for orthotics today. She is going to think as to whether or not she would like to have surgical intervention regarding this left foot issue year. She will follow-up with me immediately for consult necessary.

## 2016-01-04 ENCOUNTER — Telehealth: Payer: Self-pay | Admitting: *Deleted

## 2016-01-04 NOTE — Telephone Encounter (Addendum)
Completed General Authorization /[place] Limit Exception Certification FAxback form x2 and faxed to [facility] [place]. 01/06/2016-Sam - [employer] [place] states information is missing form PA form for Diclofenac Sodium 1% Gel. I spoke with Sam and answer pt's health questions concerning taking oral NSAIDS, GI problems, age related questions. Sam states she will turn in to the pharmacist for review and contact us again. 01/11/2016-DrMilinda Pointer informed of insurance denial of Diclofenac gel cover, ordered [employer] antiinflammatory cream.Left message informing pt the Diclofenac Gel was denied coverage by [employer], that Dr.Hyatt had ordered a alternative cream, informed of [employer] contact line. Faxed orders to [employer].

## 2016-01-11 MED ORDER — NONFORMULARY OR COMPOUNDED ITEM: 2 refills | Status: DC

## 2016-02-11 ENCOUNTER — Telehealth: Payer: Self-pay | Admitting: *Deleted

## 2016-02-11 NOTE — Telephone Encounter (Signed)
[employer] APPROVED DICLOFENAC GEL 1% [id number] - RX#:  [id number], EFFECTIVE FROM 02/10/2016 - 02/09/2038. Faxed to [place] [facility].

## 2016-09-02 ENCOUNTER — Other Ambulatory Visit: Payer: Self-pay | Admitting: *Deleted

## 2016-09-02 DIAGNOSIS — N6091 Unspecified benign mammary dysplasia of right breast: Secondary | ICD-10-CM

## 2016-09-05 ENCOUNTER — Other Ambulatory Visit: Payer: 59

## 2016-09-05 ENCOUNTER — Ambulatory Visit (HOSPITAL_BASED_OUTPATIENT_CLINIC_OR_DEPARTMENT_OTHER): Payer: 59 | Admitting: Oncology

## 2016-09-05 ENCOUNTER — Encounter: Payer: Self-pay | Admitting: Oncology

## 2016-09-05 VITALS — BP 159/89 | HR 88 | Temp 98.3°F | Resp 18 | Ht 62.0 in | Wt 155.8 lb

## 2016-09-05 DIAGNOSIS — N62 Hypertrophy of breast: Secondary | ICD-10-CM

## 2016-09-05 DIAGNOSIS — N6091 Unspecified benign mammary dysplasia of right breast: Secondary | ICD-10-CM

## 2016-09-05 NOTE — Progress Notes (Signed)
[place]  Telephone:(336) [phone] Fax:(336) [phone]     ID: Ashley Marks DOB: 1960-11-08  MR#: [id number]  KGY#:[id number]  Patient Care Team: Crist Infante, MD as PCP - General (Internal Medicine) Molli Posey, MD as Consulting Physician (Obstetrics and Gynecology) [name], MD as Consulting Physician (General Surgery) Madelena Maturin, Virgie Dad, MD as Consulting Physician (Oncology) Gatha Mayer, MD as Consulting Physician (Gastroenterology) OTHER MD:  CHIEF COMPLAINT: Atypical lobular hyperplasia  CURRENT TREATMENT: Tamoxifen  RESEARCH PROTOCOL: n/a  HISTORY of PRESENT ILLNESS: From the original intake note:  Ashley Marks had screening mammography at Dr. Delanna Ahmadi April 2017 showing some calcifications in the upper outer quadrant of the right breast. She was recalled for right diagnostic mammography at the [facility] 06/29/2015. This showed some punctate calcifications measuring 0.5 cm in the lateral right breast. Some of these had been present in the patient's prior mammogram from 2012. Biopsy of this area was performed 07/03/2015, and showed ([id number]) atypical lobular hyperplasia with calcifications. This was felt to be possibly discordant.  The patient was referred to surgery and after appropriate discussion proceeded to radioactive seed localized excisional biopsy 08/06/2015. Pathology from this procedure (SZA [id number]) showed atypical lobular hyperplasia.  Her subsequent history is as detailed below  INTERVAL HISTORY: Ashley Marks returns today for follow-up of her atypical lobular hyperplasia. She has been on tamoxifen now approximately one year. She is tolerating this well. She has rare hot flashes. Vaginal wetness is a minimal concern. She obtains a drug at a very good price.  REVIEW OF SYSTEMS: She is very concerned about her mother who has severe likely soon terminal dementia. Otherwise Ashley Marks continues to work full-time, just returned from [employer] trip,  exercises regularly with a trainer 3 times a week and walking on off days, and generally a detailed review of systems today was benign  No Known Allergies  Current Outpatient Prescriptions  Medication Sig Dispense Refill  . diclofenac sodium (VOLTAREN) 1 % GEL Apply 4 g topically 4 (four) times daily. 100 g 2  . NONFORMULARY OR COMPOUNDED ITEM [facility]:  Antiinflammatory cream - Diclofenac 3%, Baclofen 2%, Lidocaine 2%, apply 1-2 grams for 3-4 times daily. 120 each 2  . tamoxifen (NOLVADEX) 10 MG tablet Take 10 mg by mouth 2 (two) times daily.     No current facility-administered medications for this visit.     PAST MEDICAL HISTORY: Past Medical History:  Diagnosis Date  . Medical history non-contributory     PAST SURGICAL HISTORY: Past Surgical History:  Procedure Laterality Date  . NO PAST SURGERIES    . RADIOACTIVE SEED GUIDED EXCISIONAL BREAST BIOPSY Right 08/06/2015   Procedure: RADIOACTIVE SEED GUIDED EXCISIONAL RIGHT BREAST BIOPSY;  Surgeon: [name], MD;  Location: [facility];  Service: General;  Laterality: Right;    FAMILY HISTORY Family History  Problem Relation Age of Onset  . Heart disease Father   . Colon cancer Neg Hx   The patient's father died from heart disease at the age of 70. The patient's mother is currently living, age 58 as of July 2017, with significant dementia. The patient had no brothers, 2 sisters. A maternal grandmother was diagnosed with breast cancer at age 61. A cousin on the maternal side was diagnosed with breast cancer approximately age 59. There is no ovarian cancer history in the family  GENETICS TESTING: meets criteria  GYNECOLOGIC HISTORY:  Menarche age 32, first live birth age 20, the patient is [place] P3. Her most recent  period was 07/30/2015. Her periods are irregular and scant. She is not on hormone replacement. 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    ADVANCED DIRECTIVES: in place  HEALTH MAINTENANCE: Social History  Substance Use Topics  . Smoking status: Never Smoker  . Smokeless tobacco: Never Used  . Alcohol use 1.0 oz/week    2 Standard drinks or equivalent per week     Colonoscopy: 2014/ [facility]  PAP: March 2017  Bone density:  Lipid panel:    OBJECTIVE: Middle-aged white woman In no acute distress   Vitals:   09/05/16 1452  BP: (!) 159/89  Pulse: 88  Resp: 18  Temp: 98.3 F (36.8 C)     Body mass index is 28.5 kg/m.    ECOG FS:0 - Asymptomatic  Sclerae unicteric, pupils round and equal Oropharynx clear and moist No cervical or supraclavicular adenopathy Lungs no rales or rhonchi Heart regular rate and rhythm Abd soft, nontender, positive bowel sounds MSK no focal spinal tenderness, no upper extremity lymphedema Neuro: nonfocal, well oriented, appropriate affect Breasts: The right breast is status post biopsy but this is in apparent. There are no masses palpated in either breast. Both axillae are benign.  LAB RESULTS:  CMP  No results found for: NA, K, CL, CO2, GLUCOSE, BUN, CREATININE, CALCIUM, PROT, ALBUMIN, AST, ALT, ALKPHOS, BILITOT, GFRNONAA, GFRAA  No results found for: KPAFRELGTCHN, LAMBDASER, KAPLAMBRATIO  No results found for: TOTALPROTELP, ALBUMINELP, A1GS, A2GS, BETS, BETA2SER, GAMS, MSPIKE, SPEI  No results found for: WBC, NEUTROABS, HGB, HCT, MCV, PLT    Chemistry   No results found for: NA, K, CL, CO2, BUN, CREATININE, GLU No results found for: CALCIUM, ALKPHOS, AST, ALT, BILITOT     No results found for: LABCA2  No components found for: [id number]  No results for input(s): INR in the last 168 hours.  Urinalysis No results found for:  COLORURINE, APPEARANCEUR, LABSPEC, PHURINE, GLUCOSEU, HGBUR, BILIRUBINUR, KETONESUR, PROTEINUR, UROBILINOGEN, NITRITE, LEUKOCYTESUR  RADIOLOGY AND OTHER STUDIES: [facility]; we have requested a copy for review  ASSESSMENT: 56 y.o. [place] woman status post right breast lumpectomy showing atypical lobular hyperplasia 08/06/2015  (1) tamoxifen started 09/14/2015  (2) genetics testing to be discussed  PLAN: Ashley Marks is tolerating the tamoxifen well and the plan will be to continue that for a total of 5 years.  I am alerting her as to the availability of genetics testing if she wishes to pursue that.  I reassured her that whether or not loss of estrogen is associated with dementia, as she has been reading, tamoxifen does not decrease the estrogen level, but works by modulating the estrogen receptor and breast cells. I suggested she read the "36 hour day" regarding her mother's difficult situation at present.  Otherwise Kadyn will return to see me in one year. She knows to call for any other problems that may develop before that visit.  Chauncey Cruel, MD   09/05/2016 3:04 PM Medical Oncology and Hematology [facility] [address] [place], [place] [address] Tel. [phone]    Fax. [phone]

## 2016-09-14 ENCOUNTER — Telehealth: Payer: Self-pay | Admitting: Emergency Medicine

## 2016-09-14 ENCOUNTER — Other Ambulatory Visit: Payer: Self-pay

## 2017-05-08 DIAGNOSIS — R21 Rash and other nonspecific skin eruption: Secondary | ICD-10-CM | POA: Diagnosis not present

## 2017-05-10 DIAGNOSIS — B86 Scabies: Secondary | ICD-10-CM | POA: Diagnosis not present

## 2017-05-10 DIAGNOSIS — L249 Irritant contact dermatitis, unspecified cause: Secondary | ICD-10-CM | POA: Diagnosis not present

## 2017-05-31 DIAGNOSIS — L309 Dermatitis, unspecified: Secondary | ICD-10-CM | POA: Diagnosis not present

## 2017-07-07 DIAGNOSIS — R82998 Other abnormal findings in urine: Secondary | ICD-10-CM | POA: Diagnosis not present

## 2017-07-07 DIAGNOSIS — Z0000 Encounter for general adult medical examination without abnormal findings: Secondary | ICD-10-CM | POA: Diagnosis not present

## 2017-07-11 DIAGNOSIS — E7849 Other hyperlipidemia: Secondary | ICD-10-CM | POA: Diagnosis not present

## 2017-07-11 DIAGNOSIS — R946 Abnormal results of thyroid function studies: Secondary | ICD-10-CM | POA: Diagnosis not present

## 2017-07-11 DIAGNOSIS — R21 Rash and other nonspecific skin eruption: Secondary | ICD-10-CM | POA: Diagnosis not present

## 2017-07-11 DIAGNOSIS — Z0000 Encounter for general adult medical examination without abnormal findings: Secondary | ICD-10-CM | POA: Diagnosis not present

## 2017-07-11 DIAGNOSIS — Z1389 Encounter for screening for other disorder: Secondary | ICD-10-CM | POA: Diagnosis not present

## 2017-07-19 DIAGNOSIS — Z01419 Encounter for gynecological examination (general) (routine) without abnormal findings: Secondary | ICD-10-CM | POA: Diagnosis not present

## 2017-07-19 DIAGNOSIS — Z1231 Encounter for screening mammogram for malignant neoplasm of breast: Secondary | ICD-10-CM | POA: Diagnosis not present

## 2017-09-05 ENCOUNTER — Inpatient Hospital Stay: Payer: 59 | Attending: Oncology | Admitting: Oncology

## 2017-09-15 IMAGING — MG MM DIAGNOSTIC UNILATERAL R
5 series · 5 of 5 positions shown · non-contrast
Comparison: Previous exam(s).

CLINICAL DATA: Callback for possible right breast calcifications

EXAM:
DIGITAL DIAGNOSTIC RIGHT MAMMOGRAM WITH CAD

[R CC (1 of 2)]
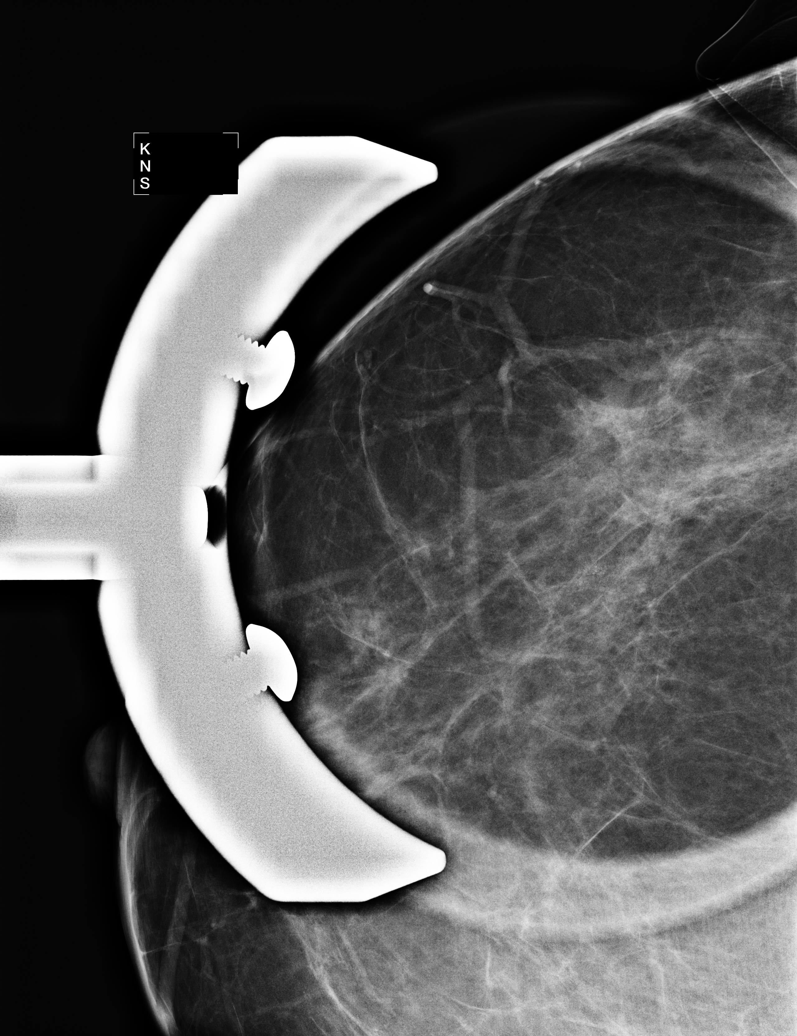

[R ML (1 of 3)]
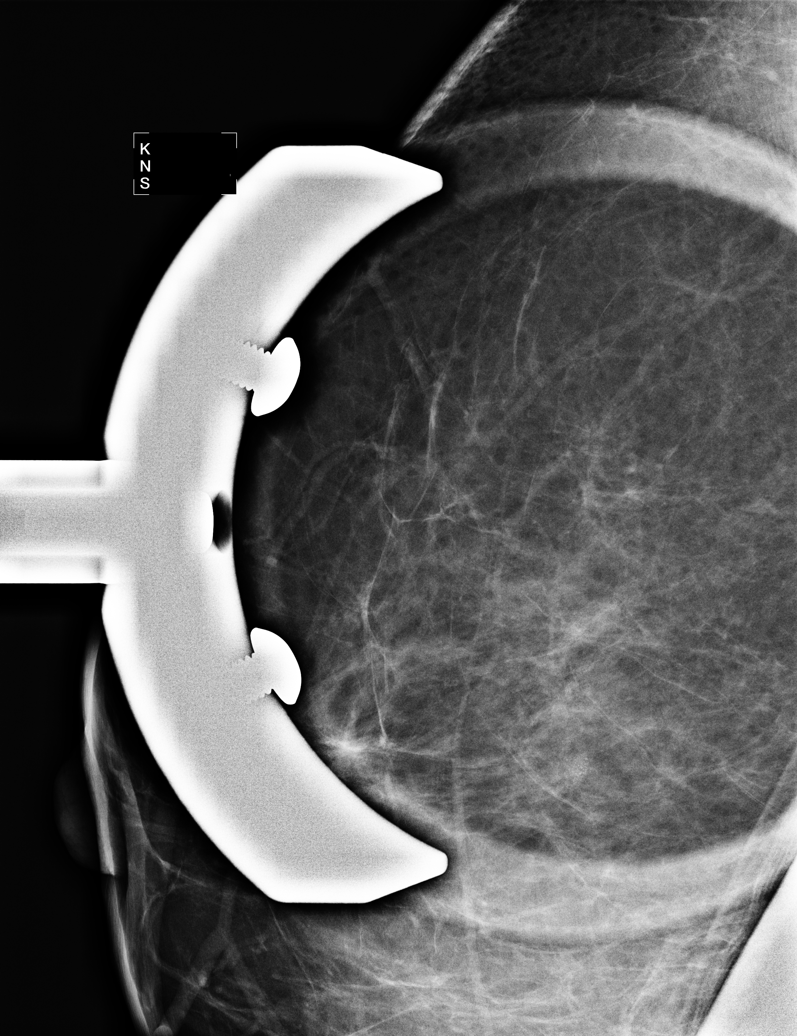

[R ML (2 of 3)]
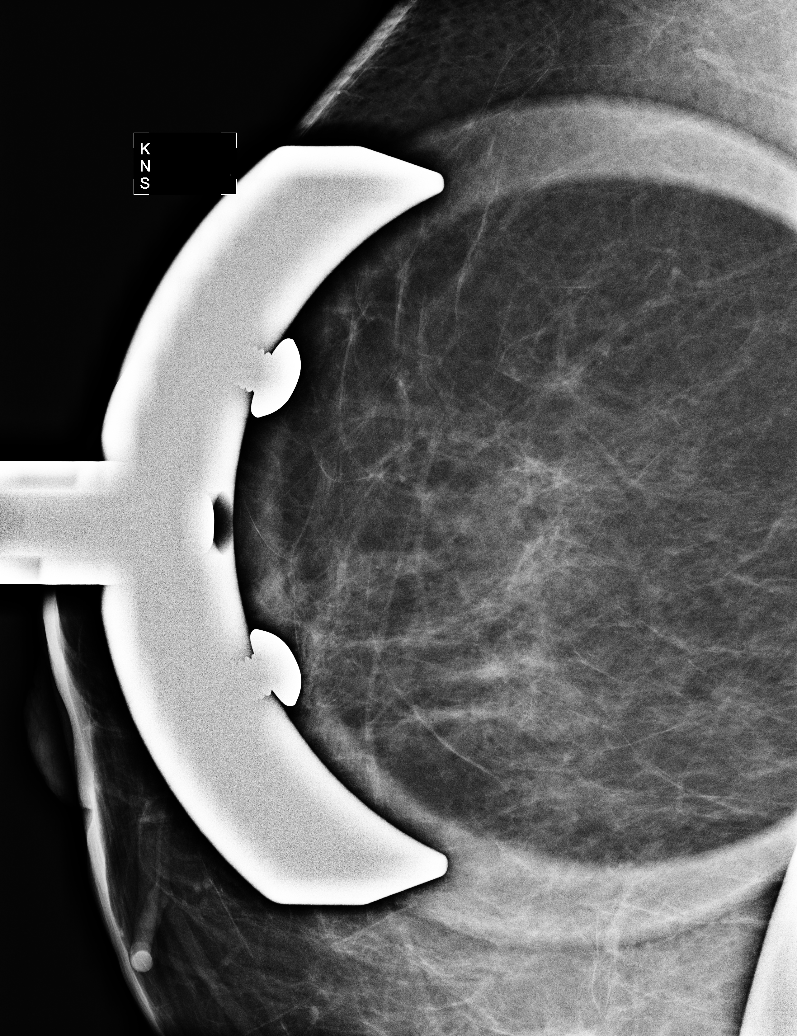

[R CC (2 of 2)]
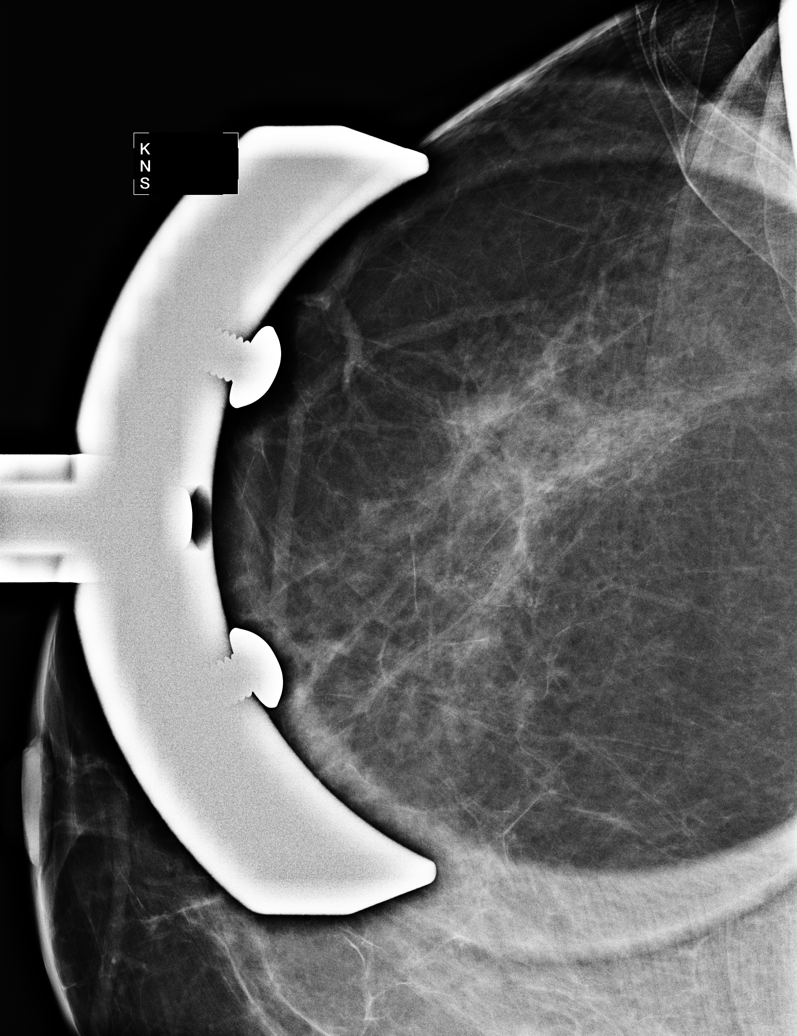

[R ML (3 of 3)]
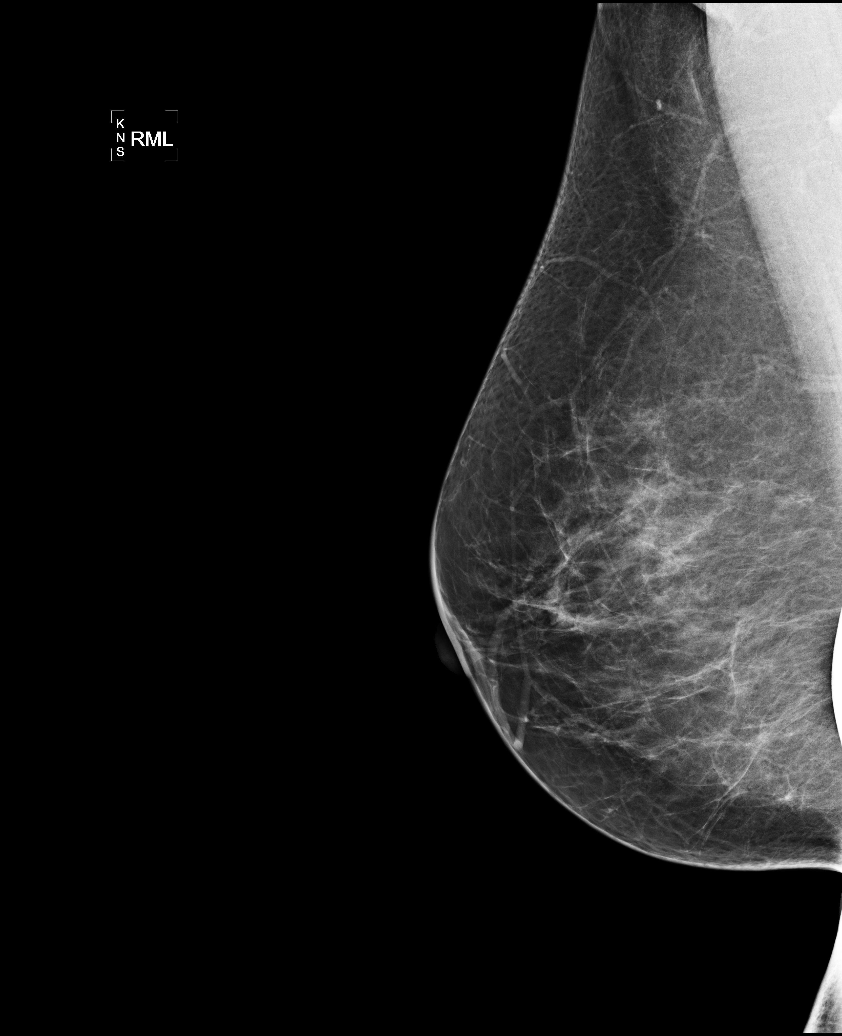

[5 of 5 positions shown; findings below may reference images not displayed]

ACR Breast Density Category b: There are scattered areas of
fibroglandular density.
FINDINGS: Magnification views of the right breast demonstrate a 5 mm group of
faint, predominantly punctate calcifications within the lateral
right breast, middle depth. Many of these calcifications appear to
have been present on the patient's 3343 mammogram although precise
comparison cannot be performed due to absence of prior magnification
views. No associated mass is identified.

Mammographic images were processed with CAD.
IMPRESSION: Probably benign right breast calcifications.

RECOMMENDATION:
Options including short-term follow-up versus stereotactic guided
biopsy were discussed with the patient. The patient wishes to pursue
biopsy. She will be scheduled at her convenience.

I have discussed the findings and recommendations with the patient.
Results were also provided in writing at the conclusion of the
visit. If applicable, a reminder letter will be sent to the patient
regarding the next appointment.

BI-RADS CATEGORY  3: Probably benign finding(s) - short interval
follow-up suggested.

## 2017-10-02 ENCOUNTER — Inpatient Hospital Stay: Payer: 59 | Admitting: Oncology

## 2017-10-16 ENCOUNTER — Encounter: Payer: Self-pay | Admitting: Adult Health

## 2017-10-16 ENCOUNTER — Telehealth: Payer: Self-pay | Admitting: Oncology

## 2017-10-16 ENCOUNTER — Inpatient Hospital Stay: Payer: 59 | Attending: Oncology | Admitting: Adult Health

## 2017-10-16 VITALS — BP 140/84 | HR 87 | Temp 99.0°F | Resp 18 | Wt 159.1 lb

## 2017-10-16 DIAGNOSIS — Z79810 Long term (current) use of selective estrogen receptor modulators (SERMs): Secondary | ICD-10-CM

## 2017-10-16 DIAGNOSIS — N6091 Unspecified benign mammary dysplasia of right breast: Secondary | ICD-10-CM

## 2017-10-16 MED ORDER — TAMOXIFEN CITRATE 10 MG PO TABS: 10.0000 mg | ORAL_TABLET | Freq: Two times a day (BID) | ORAL | 3 refills | Status: DC

## 2017-10-16 NOTE — Progress Notes (Signed)
[place]  Telephone:(336) [phone] Fax:(336) [phone]     ID: Ashley Marks DOB: [birth year]  MR#: [id number]  ZHG#:[id number]  Patient Care Team: Crist Infante, MD as PCP - General (Internal Medicine) Molli Posey, MD as Consulting Physician (Obstetrics and Gynecology) [name], MD as Consulting Physician (General Surgery) Magrinat, Virgie Dad, MD as Consulting Physician (Oncology) Gatha Mayer, MD as Consulting Physician (Gastroenterology) OTHER MD:  CHIEF COMPLAINT: Atypical lobular hyperplasia  CURRENT TREATMENT: Tamoxifen  RESEARCH PROTOCOL: n/a  HISTORY of PRESENT ILLNESS: From the original intake note:  Ashley Marks had screening mammography at Dr. Delanna Ahmadi April 2017 showing some calcifications in the upper outer quadrant of the right breast. She was recalled for right diagnostic mammography at the [facility] 06/29/2015. This showed some punctate calcifications measuring 0.5 cm in the lateral right breast. Some of these had been present in the patient's prior mammogram from 2012. Biopsy of this area was performed 07/03/2015, and showed ([id number]) atypical lobular hyperplasia with calcifications. This was felt to be possibly discordant.  The patient was referred to surgery and after appropriate discussion proceeded to radioactive seed localized excisional biopsy 08/06/2015. Pathology from this procedure ([id number] [id number]) showed atypical lobular hyperplasia.  Her subsequent history is as detailed below  INTERVAL HISTORY: Ashley Marks returns today for follow-up of her atypical lobular hyperplasia. She is taking Tamoxifen daily and is hot all the time, but otherwise well.    REVIEW OF SYSTEMS: Ashley Marks is up to date with her cancer screenings.  She sees her PCP regularly.  She is seeing gynecology regularly for her pelvic exam.  Ashley Marks is not having any issues today.  A detailed ROS was conducted and is completely non contributory otherwise.    No Known  Allergies  Current Outpatient Medications  Medication Sig Dispense Refill  . tamoxifen (NOLVADEX) 10 MG tablet Take 10 mg by mouth 2 (two) times daily.     No current facility-administered medications for this visit.     PAST MEDICAL HISTORY: Past Medical History:  Diagnosis Date  . Medical history non-contributory     PAST SURGICAL HISTORY: Past Surgical History:  Procedure Laterality Date  . NO PAST SURGERIES    . RADIOACTIVE SEED GUIDED EXCISIONAL BREAST BIOPSY Right 08/06/2015   Procedure: RADIOACTIVE SEED GUIDED EXCISIONAL RIGHT BREAST BIOPSY;  Surgeon: [name], MD;  Location: [facility];  Service: General;  Laterality: Right;    FAMILY HISTORY Family History  Problem Relation Age of Onset  . Heart disease Father   . Colon cancer Neg Hx   The patient's father died from heart disease at the age of 73. The patient's mother is currently living, age 31 as of July 2017, with significant dementia. The patient had no brothers, 2 sisters. A maternal grandmother was diagnosed with breast cancer at age 19. A cousin on the maternal side was diagnosed with breast cancer approximately age 58. There is no ovarian cancer history in the family  GENETICS TESTING: meets criteria  GYNECOLOGIC HISTORY:  Menarche age 41, first live birth age 70, the patient is [place] P3. Her most recent period was 07/30/2015. Her periods are irregular and scant. She is not on hormone replacement. 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    ADVANCED DIRECTIVES: in place  HEALTH MAINTENANCE: Social History   Tobacco Use  . Smoking status: Never Smoker  . Smokeless tobacco: Never Used   Substance Use Topics  . Alcohol use: Yes    Alcohol/week: 2.0 standard drinks    Types: 2 Standard drinks or equivalent per week  . Drug use: No     Colonoscopy: 2014/ [facility]  PAP: March 2017  Bone density:  Lipid panel:    OBJECTIVE:  Vitals:   10/16/17 1356  BP: 140/84  Pulse: 87  Resp: 18  Temp: 99 F (37.2 C)  SpO2: 97%     Body mass index is 29.09 kg/m.    ECOG FS:0 - Asymptomatic GENERAL: Patient is a well appearing female in no acute distress HEENT:  Sclerae anicteric.  Oropharynx clear and moist. No ulcerations or evidence of oropharyngeal candidiasis. Neck is supple.  NODES:  No cervical, supraclavicular, or axillary lymphadenopathy palpated.  BREAST EXAM:  Right breast s/p lumpectomy, no nodules, or masses noted, no nipple changes noted, left breast without nodules, masses, skin/nipple changes LUNGS:  Clear to auscultation bilaterally.  No wheezes or rhonchi. HEART:  Regular rate and rhythm. No murmur appreciated. ABDOMEN:  Soft, nontender.  Positive, normoactive bowel sounds. No organomegaly palpated. MSK:  No focal spinal tenderness to palpation. Full range of motion bilaterally in the upper extremities. EXTREMITIES:  No peripheral edema.   SKIN:  Clear with no obvious rashes or skin changes. No nail dyscrasia. NEURO:  Nonfocal. Well oriented.  Appropriate affect.    LAB RESULTS:  CMP  No results found for: NA, K, CL, CO2, GLUCOSE, BUN, CREATININE, CALCIUM, PROT, ALBUMIN, AST, ALT, ALKPHOS, BILITOT, GFRNONAA, GFRAA  No results found for: KPAFRELGTCHN, LAMBDASER, KAPLAMBRATIO  No results found for: TOTALPROTELP, ALBUMINELP, A1GS, A2GS, BETS, BETA2SER, GAMS, MSPIKE, SPEI  No results found for: WBC, NEUTROABS, HGB, HCT, MCV, PLT    Chemistry   No results found for: NA, K, CL, CO2, BUN, CREATININE, GLU No results found for: CALCIUM, ALKPHOS, AST, ALT, BILITOT     No results found for: LABCA2  No components found for: [id number]  No results for  input(s): INR in the last 168 hours.  Urinalysis No results found for: COLORURINE, APPEARANCEUR, LABSPEC, PHURINE, GLUCOSEU, HGBUR, BILIRUBINUR, KETONESUR, PROTEINUR, UROBILINOGEN, NITRITE, LEUKOCYTESUR  RADIOLOGY AND OTHER STUDIES: [facility]; we have requested a copy for review  ASSESSMENT: 57 y.o. [place] woman status post right breast lumpectomy showing atypical lobular hyperplasia 08/06/2015  (1) tamoxifen started 09/14/2015  (2) genetics testing to be discussed  PLAN: Ashley Marks is doing well today.  She is tolerating tamoxifen well.  She does have unpleasant hot feeling, but tolerates this well and has chosen to look at it as the fact that Tamoxifen is having an effect on her.  I counseled her on keeping up to date with her cancer screenings and healthy diet and exercise.  She will return in one year for f/u with Dr. Jana Hakim.    A total of (20) minutes of face-to-face time was spent with this patient with greater than 50% of that time in counseling and care-coordination.   Scot Dock, NP   10/16/2017 2:01 PM Medical Oncology and Hematology [facility] [address] [place], [place] [address] Tel. [phone]    Fax. [phone]

## 2017-10-16 NOTE — Telephone Encounter (Signed)
Gave avs and calendar ° °

## 2017-10-24 ENCOUNTER — Other Ambulatory Visit: Payer: Self-pay | Admitting: Oncology

## 2017-11-03 DIAGNOSIS — N6091 Unspecified benign mammary dysplasia of right breast: Secondary | ICD-10-CM | POA: Diagnosis not present

## 2018-01-30 ENCOUNTER — Other Ambulatory Visit: Payer: Self-pay | Admitting: Oncology

## 2018-09-15 ENCOUNTER — Other Ambulatory Visit: Payer: Self-pay | Admitting: Oncology

## 2018-10-17 ENCOUNTER — Inpatient Hospital Stay: Payer: 59 | Attending: Oncology | Admitting: Oncology

## 2018-10-17 ENCOUNTER — Other Ambulatory Visit: Payer: Self-pay

## 2018-10-17 VITALS — BP 145/81 | HR 95 | Temp 99.1°F | Resp 18 | Wt 152.1 lb

## 2018-10-17 DIAGNOSIS — Z803 Family history of malignant neoplasm of breast: Secondary | ICD-10-CM | POA: Insufficient documentation

## 2018-10-17 DIAGNOSIS — Z79810 Long term (current) use of selective estrogen receptor modulators (SERMs): Secondary | ICD-10-CM | POA: Diagnosis not present

## 2018-10-17 DIAGNOSIS — R232 Flushing: Secondary | ICD-10-CM | POA: Insufficient documentation

## 2018-10-17 DIAGNOSIS — N6091 Unspecified benign mammary dysplasia of right breast: Secondary | ICD-10-CM

## 2018-10-17 NOTE — Progress Notes (Signed)
[place]  Telephone:(336) [phone] Fax:(336) [phone]     ID: [name] DOB: 02-10-1961  MR#: [id number]  VFI#:[id number]  Patient Care Team: Crist Infante, MD as PCP - General (Internal Medicine) Molli Posey, MD as Consulting Physician (Obstetrics and Gynecology) [name], MD as Consulting Physician (General Surgery) Curtiss Mahmood, Virgie Dad, MD as Consulting Physician (Oncology) Gatha Mayer, MD as Consulting Physician (Gastroenterology) OTHER MD:  CHIEF COMPLAINT: Atypical lobular hyperplasia  CURRENT TREATMENT: Tamoxifen   INTERVAL HISTORY: Athalee returns today for follow-up and treatment of her atypical lobular hyperplasia. She was last seen on 10/16/2017.   She continues on tamoxifen.  She has some nighttime hot flashes, which she could describes as frequent but mild.  She is not interested in gabapentin or any other intervention for this problem.  She gets mammography at Dr. Delanna Ahmadi but has not yet had it this year.  She tells me it scheduled for the first week in September.   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  Is just she and her husband at home and they both work in the same place.  1 of their children is currently working at the business as well but not living with them.  She has 4 dogs and she walks at least one dog perhaps 3 times a week.  She is not going to the gym right now of course.  Once the weather improves she thinks she will want to walk the dogs more frequently.  She is otherwise not exercising.  A detailed review of systems today was stable.  HISTORY of PRESENT ILLNESS: From the original intake note:  Sameen had screening mammography at Dr. Delanna Ahmadi April 2017 showing some calcifications in the upper outer quadrant of the right breast. She was recalled for right diagnostic mammography at the [place] 06/29/2015. This showed some punctate calcifications  measuring 0.5 cm in the lateral right breast. Some of these had been present in the patient's prior mammogram from 2012. Biopsy of this area was performed 07/03/2015, and showed ([id number]) atypical lobular hyperplasia with calcifications. This was felt to be possibly discordant.  The patient was referred to surgery and after appropriate discussion proceeded to radioactive seed localized excisional biopsy 08/06/2015. Pathology from this procedure (SZA [id number]) showed atypical lobular hyperplasia.  Her subsequent history is as detailed below   PAST MEDICAL HISTORY: Past Medical History:  Diagnosis Date  . Medical history non-contributory     PAST SURGICAL HISTORY: Past Surgical History:  Procedure Laterality Date  . NO PAST SURGERIES    . RADIOACTIVE SEED GUIDED EXCISIONAL BREAST BIOPSY Right 08/06/2015   Procedure: RADIOACTIVE SEED GUIDED EXCISIONAL RIGHT BREAST BIOPSY;  Surgeon: [name], MD;  Location: [facility];  Service: General;  Laterality: Right;    FAMILY HISTORY Family History  Problem Relation Age of Onset  . Heart disease Father   . Colon cancer Neg Hx   The patient's father died from heart disease at the age of 85. The patient's mother is currently living, age 76 as of July 2017, with significant dementia. The patient had no brothers, 2 sisters. A maternal grandmother was diagnosed with breast cancer at age 46. A cousin on the maternal side was diagnosed with breast cancer approximately age 60. There is no ovarian cancer history in the family   GYNECOLOGIC HISTORY:  Menarche age 73, first live birth age 74, the patient is [place] P3. Her  most recent period was 07/30/2015. Her periods are irregular and scant. She is not on hormone replacement. 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    ADVANCED DIRECTIVES: in place   HEALTH MAINTENANCE: Social History   Tobacco Use  . Smoking status: Never Smoker  . Smokeless tobacco: Never Used  Substance Use Topics  . Alcohol use: Yes    Alcohol/week: 2.0 standard drinks    Types: 2 Standard drinks or equivalent per week  . Drug use: No     Colonoscopy: 2014/ [facility]  PAP: March 2017  Bone density:  Lipid panel:  No Known Allergies  Current Outpatient Medications  Medication Sig Dispense Refill  . tamoxifen (NOLVADEX) 10 MG tablet Take 1 tablet (10 mg total) by mouth 2 (two) times daily. 180 tablet 3  . tamoxifen (NOLVADEX) 20 MG tablet TAKE 1 TABLET BY MOUTH DAILY 90 tablet 0   No current facility-administered medications for this visit.     OBJECTIVE: Middle-aged white woman in no acute distress  Vitals:   10/17/18 1438  BP: (!) 145/81  Pulse: 95  Resp: 18  Temp: 99.1 F (37.3 C)  SpO2: 92%    Body mass index is 27.82 kg/m.     ECOG FS:0 - Asymptomatic   Sclerae unicteric, EOMs intact Wearing a mask No cervical or supraclavicular adenopathy Lungs no rales or rhonchi Heart regular rate and rhythm Abd soft, nontender, positive bowel sounds MSK no focal spinal tenderness, no upper extremity lymphedema Neuro: nonfocal, well oriented, appropriate affect Breasts: The right breast is status post lumpectomy.  The periareolar incision is barely visible.  The cosmetic result is excellent.  There is no skin or nipple change of concern and there are no palpable masses.  The left breast is unremarkable.  Both axillae are benign.   LAB RESULTS:  CMP  No results found for: NA, K, CL, CO2, GLUCOSE, BUN, CREATININE, CALCIUM, PROT, ALBUMIN, AST, ALT, ALKPHOS, BILITOT, GFRNONAA, GFRAA  No results found for: KPAFRELGTCHN, LAMBDASER, KAPLAMBRATIO  No results found for: TOTALPROTELP,  ALBUMINELP, A1GS, A2GS, BETS, BETA2SER, GAMS, MSPIKE, SPEI  No results found for: WBC, NEUTROABS, HGB, HCT, MCV, PLT    Chemistry   No results found for: NA, K, CL, CO2, BUN, CREATININE, GLU No results found for: CALCIUM, ALKPHOS, AST, ALT, BILITOT     No results found for: LABCA2  No components found for: [id number]  No results for input(s): INR in the last 168 hours.  Urinalysis No results found for: COLORURINE, APPEARANCEUR, LABSPEC, PHURINE, GLUCOSEU, HGBUR, BILIRUBINUR, KETONESUR, PROTEINUR, UROBILINOGEN, NITRITE, LEUKOCYTESUR    STUDIES: No results found.   ASSESSMENT: 58 y.o. [place] woman status post right breast lumpectomy showing atypical lobular hyperplasia 08/06/2015  (1) tamoxifen started 09/14/2015  (2) genetics testing to be discussed   PLAN: Lesleigh is just starting her fourth year of tamoxifen.  She understands that for prophylaxis 5-year on tamoxifen is all we have data for.  After that she may switch to some form of intensified screening.  That will depend on her breast density at that time  She will have mammography at Dr. Delanna Ahmadi next month.  I have given her our fax number so she  can request we get a copy.  I offered her gabapentin for the nighttime hot flashes but at this point she does not feel that is necessary.  Otherwise she will see [place] again late September 2021.  She knows to call for any other issue that may develop before that time.    Charm Stenner, Virgie Dad, MD  10/17/18 3:03 PM Medical Oncology and Hematology [facility] [address] [place], [place] [address] Tel. [phone]    Fax. [phone]   I, Wilburn Mylar, am acting as scribe for Dr. Virgie Dad. Marsean Elkhatib.  I, Lurline Del MD, have reviewed the above documentation for accuracy and completeness, and I agree with the above.

## 2018-10-19 ENCOUNTER — Telehealth: Payer: Self-pay | Admitting: Oncology

## 2018-10-19 NOTE — Telephone Encounter (Signed)
I talk with patient regarding schedule  

## 2018-10-30 DIAGNOSIS — I10 Essential (primary) hypertension: Secondary | ICD-10-CM | POA: Insufficient documentation

## 2018-12-19 ENCOUNTER — Other Ambulatory Visit: Payer: Self-pay | Admitting: Oncology

## 2019-05-14 ENCOUNTER — Other Ambulatory Visit: Payer: Self-pay | Admitting: Oncology

## 2019-08-12 ENCOUNTER — Other Ambulatory Visit: Payer: Self-pay | Admitting: Oncology

## 2019-09-17 ENCOUNTER — Other Ambulatory Visit: Payer: Self-pay | Admitting: Internal Medicine

## 2019-09-17 DIAGNOSIS — E785 Hyperlipidemia, unspecified: Secondary | ICD-10-CM

## 2019-10-02 ENCOUNTER — Ambulatory Visit
Admission: RE | Admit: 2019-10-02 | Discharge: 2019-10-02 | Disposition: A | Discharge status: Home / Self Care | Payer: No Typology Code available for payment source | Source: Ambulatory Visit | Attending: Internal Medicine | Admitting: Internal Medicine

## 2019-10-02 DIAGNOSIS — E785 Hyperlipidemia, unspecified: Secondary | ICD-10-CM

## 2019-11-17 NOTE — Progress Notes (Signed)
[place]  [phone] [phone]     ID: Ashley Marks DOB: Feb 18, 1961  MR#: [id number]  KDX#:[id number]  Patient Care Team: Crist Infante, MD as PCP - General (Internal Medicine) Molli Posey, MD as Consulting Physician (Obstetrics and Gynecology) [name], MD as Consulting Physician (General Surgery) Javien Tesch, Virgie Dad, MD as Consulting Physician (Oncology) Gatha Mayer, MD as Consulting Physician (Gastroenterology) OTHER MD:  CHIEF COMPLAINT: Atypical lobular hyperplasia  CURRENT TREATMENT: Tamoxifen   INTERVAL HISTORY: Ashley Marks returns today for follow-up of her atypical lobular hyperplasia.   She continues on tamoxifen.  She feels warm in the evening, but does not really have hot flashes.  She does not have problems with vaginal wetness.  She underwent bilateral diagnostic mammography with tomography at [facility] on 11/28/2018 showing: breast density category B; no evidence of malignancy in either breast.  Repeat is due within the next month   REVIEW OF SYSTEMS: Ashley Marks had the J&J vaccine and tolerated it well.  She walks an hour 3 to 4 days a week, usually 3 to 4 miles.  She has not yet gone back to the gym.  A detailed review of systems today is otherwise stable   HISTORY of PRESENT ILLNESS: From the original intake note:  Ashley Marks had screening mammography at Dr. Delanna Ahmadi April 2017 showing some calcifications in the upper outer quadrant of the right breast. She was recalled for right diagnostic mammography at the [facility] 06/29/2015. This showed some punctate calcifications measuring 0.5 cm in the lateral right breast. Some of these had been present in the patient's prior mammogram from 2012. Biopsy of this area was performed 07/03/2015, and showed ([id number]) atypical lobular hyperplasia with calcifications. This was felt to be possibly discordant.  The patient was referred to surgery and after appropriate  discussion proceeded to radioactive seed localized excisional biopsy 08/06/2015. Pathology from this procedure ([id number]) showed atypical lobular hyperplasia.  Her subsequent history is as detailed below   PAST MEDICAL HISTORY: Past Medical History:  Diagnosis Date  . Medical history non-contributory     PAST SURGICAL HISTORY: Past Surgical History:  Procedure Laterality Date  . NO PAST SURGERIES    . RADIOACTIVE SEED GUIDED EXCISIONAL BREAST BIOPSY Right 08/06/2015   Procedure: RADIOACTIVE SEED GUIDED EXCISIONAL RIGHT BREAST BIOPSY;  Surgeon: [name], MD;  Location: [facility];  Service: General;  Laterality: Right;    FAMILY HISTORY Family History  Problem Relation Age of Onset  . Heart disease Father   . Colon cancer Neg Hx   The patient's father died from heart disease at the age of 77. The patient's mother is currently living, age 17 as of July 2017, with significant dementia. The patient had no brothers, 2 sisters. A maternal grandmother was diagnosed with breast cancer at age 44. A cousin on the maternal side was diagnosed with breast cancer approximately age 43. There is no ovarian cancer history in the family   GYNECOLOGIC HISTORY:  Menarche age 20, first live birth age 48, the patient is [place] P3. Her most recent period was 07/30/2015. Her periods are irregular and scant. She is not on hormone replacement. 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 Their son Sharol Roussel works as Government social research officer for the [employer], son Liane Comber is a Therapist, sports for [employer], and son Mallie Mussel  is a sophomore at [employer].    ADVANCED DIRECTIVES: in place   HEALTH MAINTENANCE: Social History   Tobacco Use  . Smoking status: Never Smoker  . Smokeless tobacco: Never Used  Substance Use Topics  . Alcohol use: Yes     Alcohol/week: 2.0 standard drinks    Types: 2 Standard drinks or equivalent per week  . Drug use: No     Colonoscopy: 2014/ [facility]  PAP: March 2017  Bone density:  Lipid panel:  No Known Allergies  Current Outpatient Medications  Medication Sig Dispense Refill  . tamoxifen (NOLVADEX) 10 MG tablet Take 1 tablet (10 mg total) by mouth 2 (two) times daily. 180 tablet 3  . tamoxifen (NOLVADEX) 20 MG tablet TAKE 1 TABLET BY MOUTH DAILY 90 tablet 0   No current facility-administered medications for this visit.    OBJECTIVE: White woman who appears well  Vitals:   11/18/19 1324  BP: (!) 145/99  Pulse: 81  Resp: 18  Temp: (!) 97.4 F (36.3 C)  SpO2: 99%    Body mass index is 28.37 kg/m.     ECOG FS:0 - Asymptomatic   Sclerae unicteric, EOMs intact Wearing a mask No cervical or supraclavicular adenopathy Lungs no rales or rhonchi Heart regular rate and rhythm Abd soft, nontender, positive bowel sounds MSK no focal spinal tenderness, no upper extremity lymphedema Neuro: nonfocal, well oriented, appropriate affect Breasts: The right breast is status post lumpectomy with an excellent cosmetic result.  There are no suspicious findings.  The left breast is benign.  Both axillae are benign.   LAB RESULTS:  CMP  No results found for: NA, K, CL, CO2, GLUCOSE, BUN, CREATININE, CALCIUM, PROT, ALBUMIN, AST, ALT, ALKPHOS, BILITOT, GFRNONAA, GFRAA  No results found for: KPAFRELGTCHN, LAMBDASER, KAPLAMBRATIO  No results found for: TOTALPROTELP, ALBUMINELP, A1GS, A2GS, BETS, BETA2SER, GAMS, MSPIKE, SPEI  No results found for: WBC, NEUTROABS, HGB, HCT, MCV, PLT    Chemistry   No results found for: NA, K, CL, CO2, BUN, CREATININE, GLU No results found for: CALCIUM, ALKPHOS, AST, ALT, BILITOT     No results found for: LABCA2  No components found for: [id number]  No results for input(s): INR in the last 168 hours.  Urinalysis No results found for: COLORURINE, APPEARANCEUR,  LABSPEC, PHURINE, GLUCOSEU, HGBUR, BILIRUBINUR, KETONESUR, PROTEINUR, UROBILINOGEN, NITRITE, LEUKOCYTESUR    STUDIES: No results found.   ASSESSMENT: 59 y.o. [place] woman status post right breast lumpectomy showing atypical lobular hyperplasia 08/06/2015  (1) tamoxifen started 09/14/2015  (2) genetics testing to be discussed   PLAN: Noralee has just started her fifth year of tamoxifen.  Today we discussed the fact that while in patients who have a history of breast cancer we sometimes extend tamoxifen up to 10 years, we do not have data for breast cancer prevention beyond 5 years of tamoxifen.  Accordingly when she sees me next year she will go off that medication.  At that time I expect she will also "graduate" from follow-up here.  Note that her breast density is low, category B.  I do not think she will significantly benefit from yearly MRIs in addition to her mammography as a result  She knows to call for any other issue that may develop before the next visit  Total encounter time 25 minutes.*  Blayton Huttner, Virgie Dad, MD  11/18/19 1:36 PM Medical Oncology and Hematology [facility] [place], [place] [address] Tel. [phone]    Fax. [phone]  Addendum: I forgot to discuss the question of genetics testing with Billiejean.  I have sent her a letter and have asked one of our counselors to call her to discuss.   I, Wilburn Mylar, am acting as scribe for Dr. Sarajane Jews C. Taiden Raybourn.  I, Lurline Del MD, have reviewed the above documentation for accuracy and completeness, and I agree with the above.   *Total Encounter Time as defined by the Centers for Medicare and Medicaid Services includes, in addition to the face-to-face time of a patient visit (documented in the note above) non-face-to-face time: obtaining and reviewing outside history, ordering and reviewing medications, tests or procedures, care coordination (communications with other health  care professionals or caregivers) and documentation in the medical record.

## 2019-11-18 ENCOUNTER — Encounter: Payer: Self-pay | Admitting: Genetic Counselor

## 2019-11-18 ENCOUNTER — Other Ambulatory Visit: Payer: Self-pay

## 2019-11-18 ENCOUNTER — Inpatient Hospital Stay: Payer: 59 | Attending: Oncology | Admitting: Oncology

## 2019-11-18 ENCOUNTER — Encounter: Payer: Self-pay | Admitting: Oncology

## 2019-11-18 VITALS — BP 145/99 | HR 81 | Temp 97.4°F | Resp 18 | Ht 62.0 in | Wt 155.1 lb

## 2019-11-18 DIAGNOSIS — Z803 Family history of malignant neoplasm of breast: Secondary | ICD-10-CM | POA: Diagnosis not present

## 2019-11-18 DIAGNOSIS — N6091 Unspecified benign mammary dysplasia of right breast: Secondary | ICD-10-CM | POA: Diagnosis not present

## 2019-11-18 DIAGNOSIS — Z79810 Long term (current) use of selective estrogen receptor modulators (SERMs): Secondary | ICD-10-CM | POA: Diagnosis not present

## 2019-11-19 ENCOUNTER — Telehealth: Payer: Self-pay | Admitting: Oncology

## 2019-11-19 NOTE — Telephone Encounter (Signed)
Scheduled appts per 9/20 los. Pt confirmed appt date and time.

## 2019-12-10 ENCOUNTER — Telehealth: Payer: Self-pay | Admitting: Genetic Counselor

## 2019-12-10 ENCOUNTER — Encounter: Payer: Self-pay | Admitting: Genetic Counselor

## 2019-12-10 NOTE — Telephone Encounter (Signed)
At the request of Dr. Jana Hakim, I called patient to set up an appointment.  She is still thinking about it at this time.  She will call and set up an appointment if she decides to proceed.

## 2019-12-20 ENCOUNTER — Other Ambulatory Visit: Payer: Self-pay

## 2019-12-20 MED ORDER — TAMOXIFEN CITRATE 20 MG PO TABS
20.0000 mg | ORAL_TABLET | Freq: Every day | ORAL | 0 refills | Status: DC
Start: 2019-12-20 — End: 2020-03-17

## 2019-12-20 MED ORDER — TAMOXIFEN CITRATE 20 MG PO TABS: 20.0000 mg | ORAL_TABLET | Freq: Every day | ORAL | 0 refills | Status: DC

## 2019-12-20 NOTE — Telephone Encounter (Signed)
Pt is aware of Tamoxifen refill being sent electronically to [facility] and states she takes thr 20 mg once daily.

## 2020-03-17 ENCOUNTER — Other Ambulatory Visit: Payer: Self-pay | Admitting: Oncology

## 2020-07-21 ENCOUNTER — Other Ambulatory Visit: Payer: Self-pay | Admitting: Oncology

## 2020-09-23 DIAGNOSIS — E785 Hyperlipidemia, unspecified: Secondary | ICD-10-CM | POA: Diagnosis not present

## 2020-09-23 DIAGNOSIS — R946 Abnormal results of thyroid function studies: Secondary | ICD-10-CM | POA: Diagnosis not present

## 2020-10-21 DIAGNOSIS — R82998 Other abnormal findings in urine: Secondary | ICD-10-CM | POA: Diagnosis not present

## 2020-10-21 DIAGNOSIS — Z0000 Encounter for general adult medical examination without abnormal findings: Secondary | ICD-10-CM | POA: Diagnosis not present

## 2020-10-21 DIAGNOSIS — Z1339 Encounter for screening examination for other mental health and behavioral disorders: Secondary | ICD-10-CM | POA: Diagnosis not present

## 2020-10-21 DIAGNOSIS — I10 Essential (primary) hypertension: Secondary | ICD-10-CM | POA: Diagnosis not present

## 2020-10-21 DIAGNOSIS — Z1331 Encounter for screening for depression: Secondary | ICD-10-CM | POA: Diagnosis not present

## 2020-10-21 DIAGNOSIS — M546 Pain in thoracic spine: Secondary | ICD-10-CM | POA: Diagnosis not present

## 2020-10-21 DIAGNOSIS — Z1212 Encounter for screening for malignant neoplasm of rectum: Secondary | ICD-10-CM | POA: Diagnosis not present

## 2020-10-21 DIAGNOSIS — Z23 Encounter for immunization: Secondary | ICD-10-CM | POA: Diagnosis not present

## 2020-11-24 ENCOUNTER — Inpatient Hospital Stay: Payer: 59 | Admitting: Oncology

## 2020-11-28 ENCOUNTER — Other Ambulatory Visit: Payer: Self-pay | Admitting: Oncology

## 2020-11-30 ENCOUNTER — Telehealth: Payer: Self-pay | Admitting: Oncology

## 2020-11-30 NOTE — Telephone Encounter (Signed)
Rescheduled per 10/3 sch msg, pt was called and confirmed appt

## 2020-12-14 ENCOUNTER — Ambulatory Visit: Payer: Self-pay | Admitting: Oncology

## 2021-01-18 DIAGNOSIS — Z6827 Body mass index (BMI) 27.0-27.9, adult: Secondary | ICD-10-CM | POA: Diagnosis not present

## 2021-01-18 DIAGNOSIS — Z01419 Encounter for gynecological examination (general) (routine) without abnormal findings: Secondary | ICD-10-CM | POA: Diagnosis not present

## 2021-01-18 DIAGNOSIS — Z1231 Encounter for screening mammogram for malignant neoplasm of breast: Secondary | ICD-10-CM | POA: Diagnosis not present

## 2021-02-09 NOTE — Progress Notes (Signed)
[place]  [phone] Fax:([phone]) [phone]     ID: Ashley Marks DOB: [birth year]  MR#: [id number]  ZHY#:[id number]  Patient Care Team: Crist Infante, MD as PCP - General (Internal Medicine) Molli Posey, MD as Consulting Physician (Obstetrics and Gynecology) [name], MD as Consulting Physician (General Surgery) Harding Thomure, Virgie Dad, MD as Consulting Physician (Oncology) Gatha Mayer, MD as Consulting Physician (Gastroenterology) OTHER MD:  CHIEF COMPLAINT: Atypical lobular hyperplasia  CURRENT TREATMENT: Completed 5 years of tamoxifen   INTERVAL HISTORY: Ashley Marks returns today for follow-up of her atypical lobular hyperplasia.   She stopped her tamoxifen in October 2022.  Since stopping she has not noticed any fewer hot flashes.  She does really not feel any different in any way.  Since her last visit, she underwent bilateral diagnostic mammography with tomography at [facility] showing: breast density category B; no evidence of malignancy in either breast.     REVIEW OF SYSTEMS: Ashley Marks walks 3-1/2 miles most days.  She also goes to the gym at times.  She has been having a little more discomfort associated with her right breast incision and also some upper back discomfort.  This has been evaluated by her primary care physician.  A detailed review of systems today was otherwise stable.   COVID 19 VACCINATION STATUS: Ashley Marks 05/2019   HISTORY of PRESENT ILLNESS: From the original intake note:  Ashley Marks had screening mammography at Dr. Delanna Ahmadi April 2017 showing some calcifications in the upper outer quadrant of the right breast. She was recalled for right diagnostic mammography at the [facility] 06/29/2015. This showed some punctate calcifications measuring 0.5 cm in the lateral right breast. Some of these had been present in the patient's prior mammogram from 2012. Biopsy of this area was performed 07/03/2015, and showed ([id number])  atypical lobular hyperplasia with calcifications. This was felt to be possibly discordant.  The patient was referred to surgery and after appropriate discussion proceeded to radioactive seed localized excisional biopsy 08/06/2015. Pathology from this procedure ([id number] [id number]) showed atypical lobular hyperplasia.  Her subsequent history is as detailed below   PAST MEDICAL HISTORY: Past Medical History:  Diagnosis Date   Medical history non-contributory     PAST SURGICAL HISTORY: Past Surgical History:  Procedure Laterality Date   NO PAST SURGERIES     RADIOACTIVE SEED GUIDED EXCISIONAL BREAST BIOPSY Right 08/06/2015   Procedure: RADIOACTIVE SEED GUIDED EXCISIONAL RIGHT BREAST BIOPSY;  Surgeon: [name], MD;  Location: [facility];  Service: General;  Laterality: Right;    FAMILY HISTORY Family History  Problem Relation Age of Onset   Heart disease Father    Dementia Mother    Breast cancer Maternal Grandmother 43   Breast cancer Cousin 64       mat first cousin   Colon cancer Neg Hx   The patient's father died from heart disease at the age of 74. The patient's mother is currently living, age 6 as of July 2017, with significant dementia. The patient had no brothers, 2 sisters. A maternal grandmother was diagnosed with breast cancer at age 80. A cousin on the maternal side was diagnosed with breast cancer approximately age 67. There is no ovarian cancer history in the family   GYNECOLOGIC HISTORY:  Menarche age 36, first live birth age 70, the patient is Ashley Marks P3. Her most recent period was 07/30/2015. Her periods are irregular and scant. She is not on hormone replacement. 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  One of their twin older sons Ashley Marks and Ashley Marks works in [place] in ophthalmology research and the other 1 works in  [place].  Youngest son Ashley Marks got married 2022 and is working in the family business.    ADVANCED DIRECTIVES: in place   HEALTH MAINTENANCE: Social History   Tobacco Use   Smoking status: Never   Smokeless tobacco: Never  Substance Use Topics   Alcohol use: Yes    Alcohol/week: 2.0 standard drinks    Types: 2 Standard drinks or equivalent per week   Drug use: No     Colonoscopy: 2014/ [facility]  PAP: March 2017  Bone density:  Lipid panel:  No Known Allergies  Current Outpatient Medications  Medication Sig Dispense Refill   tamoxifen (NOLVADEX) 20 MG tablet TAKE ONE TABLET BY MOUTH ONE TIME DAILY 90 tablet 0   No current facility-administered medications for this visit.    OBJECTIVE: White woman who appears younger than stated age  33:   02/10/21 1417  BP: (!) 146/94  Pulse: 95  Resp: 18  Temp: (!) 97.4 F (36.3 C)  SpO2: 98%     Body mass index is 28.35 kg/m.     ECOG FS:0 - Asymptomatic   Sclerae unicteric, EOMs intact Wearing a mask No cervical or supraclavicular adenopathy Lungs no rales or rhonchi Heart regular rate and rhythm Abd soft, nontender, positive bowel sounds MSK no focal spinal tenderness, no upper extremity lymphedema Neuro: nonfocal, well oriented, appropriate affect Breasts: The right breast is status postlumpectomy.  The incision is barely noticeable.  There is no subjacent mass or any skin or nipple change of concern.  Left breast and both axillae are benign.   LAB RESULTS:  CMP  No results found for: NA, K, CL, CO2, GLUCOSE, BUN, CREATININE, CALCIUM, PROT, ALBUMIN, AST, ALT, ALKPHOS, BILITOT, GFRNONAA, GFRAA  No results found for: KPAFRELGTCHN, LAMBDASER, KAPLAMBRATIO  No results found for: TOTALPROTELP, ALBUMINELP, A1GS, A2GS, BETS, BETA2SER, GAMS, MSPIKE, SPEI  No results found for: WBC, NEUTROABS, HGB, HCT, MCV, PLT    Chemistry   No results found for: NA, K, CL, CO2, BUN, CREATININE, GLU No results found for:  CALCIUM, ALKPHOS, AST, ALT, BILITOT     No results found for: LABCA2  No components found for: [id number]  No results for input(s): INR in the last 168 hours.  Urinalysis No results found for: COLORURINE, APPEARANCEUR, LABSPEC, PHURINE, GLUCOSEU, HGBUR, BILIRUBINUR, KETONESUR, PROTEINUR, UROBILINOGEN, NITRITE, LEUKOCYTESUR    STUDIES: No results found.   ASSESSMENT: 60 y.o. [place] woman status post right breast lumpectomy showing atypical lobular hyperplasia 08/06/2015  (1) tamoxifen started 09/14/2015, completing 5+ years October 2022  (2) genetics testing being considered   PLAN: Meara h completed her 5 years of tamoxifen.  By taking that she has cut her risk of breast cancer developing in half.  There is no data that continuing beyond 5 years would be useful.  I think the discomfort she is having in the right breast is not going to be related to her upper back problems.  It is simply due to remodeling which is something all scars do.  Over time that may entrapment nerve and cause some pain or soreness but again with more time they continue to change and eventually released the issue.  Ice is simple way to numb that area if it is persistently  uncomfortable.  As far as the upper back is concerned we discussed certain exercises to isolate and strengthen the paraspinal muscles  At this point I feel comfortable releasing her to her primary care physicians.  All she will need in terms of breast cancer follow-up is her yearly mammography and a yearly physician breast exam.    We will be glad to see Nandi again at any point in the future if and when the need arises but as of now are making no further routine appointments for her here.  Total encounter time 20 minutes.*   Chrishauna Mee, Virgie Dad, MD  02/10/21 2:35 PM Medical Oncology and Hematology [facility] [place], [place] [address] Tel. [phone]    Fax. [phone]   I, Wilburn Mylar, am  acting as scribe for Dr. Virgie Dad. Trena Dunavan.  I, Lurline Del MD, have reviewed the above documentation for accuracy and completeness, and I agree with the above.   *Total Encounter Time as defined by the Centers for Medicare and Medicaid Services includes, in addition to the face-to-face time of a patient visit (documented in the note above) non-face-to-face time: obtaining and reviewing outside history, ordering and reviewing medications, tests or procedures, care coordination (communications with other health care professionals or caregivers) and documentation in the medical record.

## 2021-02-10 ENCOUNTER — Inpatient Hospital Stay: Payer: BC Managed Care – PPO | Attending: Oncology | Admitting: Oncology

## 2021-02-10 ENCOUNTER — Other Ambulatory Visit: Payer: Self-pay

## 2021-02-10 VITALS — BP 146/94 | HR 95 | Temp 97.4°F | Resp 18 | Ht 62.0 in | Wt 155.0 lb

## 2021-02-10 DIAGNOSIS — Z803 Family history of malignant neoplasm of breast: Secondary | ICD-10-CM | POA: Insufficient documentation

## 2021-02-10 DIAGNOSIS — N6091 Unspecified benign mammary dysplasia of right breast: Secondary | ICD-10-CM

## 2021-04-22 DIAGNOSIS — M65331 Trigger finger, right middle finger: Secondary | ICD-10-CM | POA: Insufficient documentation

## 2021-04-22 DIAGNOSIS — M65341 Trigger finger, right ring finger: Secondary | ICD-10-CM | POA: Diagnosis not present

## 2021-09-29 DIAGNOSIS — M7742 Metatarsalgia, left foot: Secondary | ICD-10-CM | POA: Insufficient documentation

## 2021-09-29 DIAGNOSIS — M21612 Bunion of left foot: Secondary | ICD-10-CM | POA: Diagnosis not present

## 2021-09-29 DIAGNOSIS — M21619 Bunion of unspecified foot: Secondary | ICD-10-CM | POA: Insufficient documentation

## 2021-10-11 ENCOUNTER — Other Ambulatory Visit (HOSPITAL_COMMUNITY): Payer: Self-pay | Admitting: Orthopedic Surgery

## 2021-11-29 DIAGNOSIS — E785 Hyperlipidemia, unspecified: Secondary | ICD-10-CM | POA: Diagnosis not present

## 2021-12-03 DIAGNOSIS — Z0000 Encounter for general adult medical examination without abnormal findings: Secondary | ICD-10-CM | POA: Diagnosis not present

## 2021-12-03 DIAGNOSIS — I10 Essential (primary) hypertension: Secondary | ICD-10-CM | POA: Diagnosis not present

## 2021-12-08 ENCOUNTER — Other Ambulatory Visit: Payer: Self-pay

## 2021-12-08 ENCOUNTER — Encounter (HOSPITAL_BASED_OUTPATIENT_CLINIC_OR_DEPARTMENT_OTHER): Payer: Self-pay | Admitting: Orthopedic Surgery

## 2021-12-14 ENCOUNTER — Encounter (HOSPITAL_BASED_OUTPATIENT_CLINIC_OR_DEPARTMENT_OTHER)
Admission: RE | Admit: 2021-12-14 | Discharge: 2021-12-14 | Disposition: A | Discharge status: Home / Self Care | Payer: BC Managed Care – PPO | Source: Ambulatory Visit | Attending: Orthopedic Surgery | Admitting: Orthopedic Surgery

## 2021-12-14 DIAGNOSIS — I10 Essential (primary) hypertension: Secondary | ICD-10-CM | POA: Diagnosis not present

## 2021-12-14 DIAGNOSIS — M21612 Bunion of left foot: Secondary | ICD-10-CM | POA: Diagnosis not present

## 2021-12-14 DIAGNOSIS — M7742 Metatarsalgia, left foot: Secondary | ICD-10-CM | POA: Diagnosis not present

## 2021-12-14 DIAGNOSIS — Z01812 Encounter for preprocedural laboratory examination: Secondary | ICD-10-CM | POA: Insufficient documentation

## 2021-12-14 DIAGNOSIS — M25375 Other instability, left foot: Secondary | ICD-10-CM | POA: Diagnosis not present

## 2021-12-14 DIAGNOSIS — M25775 Osteophyte, left foot: Secondary | ICD-10-CM | POA: Diagnosis not present

## 2021-12-14 NOTE — Progress Notes (Signed)

## 2021-12-15 NOTE — Anesthesia Preprocedure Evaluation (Signed)
Anesthesia Evaluation  Patient identified by MRN, date of birth, ID band Patient awake    Reviewed: Allergy & Precautions, NPO status , Patient's Chart, lab work & pertinent test results  History of Anesthesia Complications Negative for: history of anesthetic complications  Airway Mallampati: II  TM Distance: >3 FB Neck ROM: Full    Dental no notable dental hx. (+) Dental Advisory Given   Pulmonary neg pulmonary ROS,    Pulmonary exam normal        Cardiovascular hypertension, Pt. on medications Normal cardiovascular exam     Neuro/Psych negative neurological ROS     GI/Hepatic negative GI ROS, Neg liver ROS,   Endo/Other  negative endocrine ROS  Renal/GU negative Renal ROS     Musculoskeletal negative musculoskeletal ROS (+)   Abdominal   Peds  Hematology negative hematology ROS (+)   Anesthesia Other Findings   Reproductive/Obstetrics                            Anesthesia Physical Anesthesia Plan  ASA: 2  Anesthesia Plan: General   Post-op Pain Management: Regional block* and Tylenol PO (pre-op)*   Induction: Intravenous  PONV Risk Score and Plan: 3 and Ondansetron, Dexamethasone and Midazolam  Airway Management Planned: LMA  Additional Equipment:   Intra-op Plan:   Post-operative Plan: Extubation in OR  Informed Consent: I have reviewed the patients History and Physical, chart, labs and discussed the procedure including the risks, benefits and alternatives for the proposed anesthesia with the patient or authorized representative who has indicated his/her understanding and acceptance.     Dental advisory given  Plan Discussed with: Anesthesiologist and CRNA  Anesthesia Plan Comments:        Anesthesia Quick Evaluation

## 2021-12-16 ENCOUNTER — Ambulatory Visit (HOSPITAL_BASED_OUTPATIENT_CLINIC_OR_DEPARTMENT_OTHER): Payer: BC Managed Care – PPO | Admitting: Anesthesiology

## 2021-12-16 ENCOUNTER — Encounter (HOSPITAL_BASED_OUTPATIENT_CLINIC_OR_DEPARTMENT_OTHER)
Admission: RE | Disposition: A | Discharge status: Home / Self Care | Payer: Self-pay | Source: Home / Self Care | Attending: Orthopedic Surgery

## 2021-12-16 ENCOUNTER — Ambulatory Visit (HOSPITAL_BASED_OUTPATIENT_CLINIC_OR_DEPARTMENT_OTHER)
Admission: RE | Admit: 2021-12-16 | Discharge: 2021-12-16 | Disposition: A | Discharge status: Home / Self Care | Payer: BC Managed Care – PPO | Attending: Orthopedic Surgery | Admitting: Orthopedic Surgery

## 2021-12-16 ENCOUNTER — Encounter (HOSPITAL_BASED_OUTPATIENT_CLINIC_OR_DEPARTMENT_OTHER): Payer: Self-pay | Admitting: Orthopedic Surgery

## 2021-12-16 ENCOUNTER — Other Ambulatory Visit: Payer: Self-pay

## 2021-12-16 ENCOUNTER — Ambulatory Visit (HOSPITAL_BASED_OUTPATIENT_CLINIC_OR_DEPARTMENT_OTHER): Payer: BC Managed Care – PPO

## 2021-12-16 DIAGNOSIS — M21612 Bunion of left foot: Secondary | ICD-10-CM

## 2021-12-16 DIAGNOSIS — M25375 Other instability, left foot: Secondary | ICD-10-CM | POA: Insufficient documentation

## 2021-12-16 DIAGNOSIS — M25775 Osteophyte, left foot: Secondary | ICD-10-CM | POA: Diagnosis not present

## 2021-12-16 DIAGNOSIS — M7742 Metatarsalgia, left foot: Secondary | ICD-10-CM | POA: Diagnosis not present

## 2021-12-16 DIAGNOSIS — I10 Essential (primary) hypertension: Secondary | ICD-10-CM | POA: Diagnosis not present

## 2021-12-16 DIAGNOSIS — G8918 Other acute postprocedural pain: Secondary | ICD-10-CM | POA: Diagnosis not present

## 2021-12-16 HISTORY — PX: WEIL OSTEOTOMY: SHX5044

## 2021-12-16 HISTORY — DX: Essential (primary) hypertension: I10

## 2021-12-16 HISTORY — DX: Bunion of unspecified foot: M21.619

## 2021-12-16 HISTORY — PX: BUNIONECTOMY: SHX129

## 2021-12-16 SURGERY — BUNIONECTOMY
Anesthesia: General | Site: Foot | Laterality: Left

## 2021-12-16 MED ORDER — 0.9 % SODIUM CHLORIDE (POUR BTL) OPTIME
TOPICAL | Status: DC | PRN
  Administered 2021-12-16: 120 mL

## 2021-12-16 MED ORDER — FENTANYL CITRATE (PF) 100 MCG/2ML IJ SOLN: 25.0000 ug | INTRAMUSCULAR | Status: DC | PRN

## 2021-12-16 MED ORDER — ROPIVACAINE HCL 5 MG/ML IJ SOLN
INTRAMUSCULAR | Status: DC | PRN
  Administered 2021-12-16: 25 mL via EPIDURAL
  Administered 2021-12-16: 15 mL via EPIDURAL

## 2021-12-16 MED ORDER — PROMETHAZINE HCL 25 MG/ML IJ SOLN: 6.2500 mg | INTRAMUSCULAR | Status: DC | PRN

## 2021-12-16 MED ORDER — MIDAZOLAM HCL 2 MG/2ML IJ SOLN
INTRAMUSCULAR | Status: AC
  Filled 2021-12-16: qty 2

## 2021-12-16 MED ORDER — ACETAMINOPHEN 500 MG PO TABS
ORAL_TABLET | ORAL | Status: AC
  Filled 2021-12-16: qty 2

## 2021-12-16 MED ORDER — AMISULPRIDE (ANTIEMETIC) 5 MG/2ML IV SOLN: 10.0000 mg | Freq: Once | INTRAVENOUS | Status: DC | PRN

## 2021-12-16 MED ORDER — ACETAMINOPHEN 500 MG PO TABS
1000.0000 mg | ORAL_TABLET | Freq: Once | ORAL | Status: AC
  Administered 2021-12-16: 1000 mg via ORAL

## 2021-12-16 MED ORDER — SODIUM CHLORIDE 0.9 % IV SOLN: INTRAVENOUS | Status: DC

## 2021-12-16 MED ORDER — BUPIVACAINE-EPINEPHRINE (PF) 0.5% -1:200000 IJ SOLN
INTRAMUSCULAR | Status: AC
  Filled 2021-12-16: qty 30

## 2021-12-16 MED ORDER — SENNA 8.6 MG PO TABS: 2.0000 | ORAL_TABLET | Freq: Two times a day (BID) | ORAL | 0 refills | Status: DC

## 2021-12-16 MED ORDER — FENTANYL CITRATE (PF) 100 MCG/2ML IJ SOLN
INTRAMUSCULAR | Status: AC
  Filled 2021-12-16: qty 2

## 2021-12-16 MED ORDER — CEFAZOLIN SODIUM-DEXTROSE 2-4 GM/100ML-% IV SOLN
INTRAVENOUS | Status: AC
  Filled 2021-12-16: qty 100

## 2021-12-16 MED ORDER — OXYCODONE HCL 5 MG PO TABS: 5.0000 mg | ORAL_TABLET | ORAL | 0 refills | Status: AC | PRN

## 2021-12-16 MED ORDER — CEFAZOLIN SODIUM-DEXTROSE 2-4 GM/100ML-% IV SOLN
2.0000 g | INTRAVENOUS | Status: AC
  Administered 2021-12-16: 2 g via INTRAVENOUS

## 2021-12-16 MED ORDER — ONDANSETRON HCL 4 MG/2ML IJ SOLN
INTRAMUSCULAR | Status: DC | PRN
  Administered 2021-12-16: 4 mg via INTRAVENOUS

## 2021-12-16 MED ORDER — MIDAZOLAM HCL 2 MG/2ML IJ SOLN
2.0000 mg | Freq: Once | INTRAMUSCULAR | Status: AC
  Administered 2021-12-16: 2 mg via INTRAVENOUS

## 2021-12-16 MED ORDER — LACTATED RINGERS IV SOLN: INTRAVENOUS | Status: DC

## 2021-12-16 MED ORDER — VANCOMYCIN HCL 500 MG IV SOLR
INTRAVENOUS | Status: DC | PRN
  Administered 2021-12-16: 250 mg via TOPICAL

## 2021-12-16 MED ORDER — FENTANYL CITRATE (PF) 100 MCG/2ML IJ SOLN
100.0000 ug | Freq: Once | INTRAMUSCULAR | Status: AC
  Administered 2021-12-16: 100 ug via INTRAVENOUS

## 2021-12-16 MED ORDER — SODIUM CHLORIDE 0.9 % IV SOLN
INTRAVENOUS | Status: AC | PRN
  Administered 2021-12-16: 50 mL

## 2021-12-16 MED ORDER — DOCUSATE SODIUM 100 MG PO CAPS: 100.0000 mg | ORAL_CAPSULE | Freq: Two times a day (BID) | ORAL | 0 refills | Status: DC

## 2021-12-16 MED ORDER — VANCOMYCIN HCL 500 MG IV SOLR
INTRAVENOUS | Status: AC
  Filled 2021-12-16: qty 10

## 2021-12-16 MED ORDER — PROPOFOL 500 MG/50ML IV EMUL
INTRAVENOUS | Status: DC | PRN
  Administered 2021-12-16: 25 ug/kg/min via INTRAVENOUS

## 2021-12-16 MED ORDER — DEXAMETHASONE SODIUM PHOSPHATE 10 MG/ML IJ SOLN
INTRAMUSCULAR | Status: DC | PRN
  Administered 2021-12-16 (×2): 2 mg
  Administered 2021-12-16: 3 mg

## 2021-12-16 SURGICAL SUPPLY — 100 items
APL PRP STRL LF DISP 70% ISPRP (MISCELLANEOUS) ×1
BANDAGE ESMARK 6X9 LF (GAUZE/BANDAGES/DRESSINGS) IMPLANT
BIT DRILL CANNULAT MICA 2.2X60 (BIT) IMPLANT
BIT DRILL MICA CANN SS 3X60 (BIT) IMPLANT
BLADE AVERAGE 25X9 (BLADE) IMPLANT
BLADE LONG MED 25X9 (BLADE) IMPLANT
BLADE MICRO SAGITTAL (BLADE) IMPLANT
BLADE MINI RND TIP GREEN BEAV (BLADE) IMPLANT
BLADE OSC/SAG .038X5.5 CUT EDG (BLADE) IMPLANT
BLADE SURG 15 STRL LF DISP TIS (BLADE) ×6 IMPLANT
BLADE SURG 15 STRL SS (BLADE) ×3
BNDG CMPR 75X21 PLY HI ABS (MISCELLANEOUS)
BNDG CMPR 9X4 STRL LF SNTH (GAUZE/BANDAGES/DRESSINGS)
BNDG CMPR 9X6 STRL LF SNTH (GAUZE/BANDAGES/DRESSINGS)
BNDG ELASTIC 4X5.8 VLCR STR LF (GAUZE/BANDAGES/DRESSINGS) ×2 IMPLANT
BNDG ELASTIC 6X5.8 VLCR STR LF (GAUZE/BANDAGES/DRESSINGS) IMPLANT
BNDG ESMARK 4X9 LF (GAUZE/BANDAGES/DRESSINGS) IMPLANT
BNDG ESMARK 6X9 LF (GAUZE/BANDAGES/DRESSINGS)
BNDG GZE 12X3 1 PLY HI ABS (GAUZE/BANDAGES/DRESSINGS) ×1
BNDG STRETCH GAUZE 3IN X12FT (GAUZE/BANDAGES/DRESSINGS) ×2 IMPLANT
BUR MICA 2X12 (BURR) IMPLANT
BUR MICA 2X20 (BURR) IMPLANT
BUR MIS CONICAL WEDGE 4.3X13 (BUR) IMPLANT
BURR CONICAL 4.3 (BUR)
BURR MICA 2X12 (BURR) ×1
BURR MICA 2X20 (BURR) ×1
BURR MIS CONICAL WEDGE 4.3X13 (BUR)
CHLORAPREP W/TINT 26 (MISCELLANEOUS) ×2 IMPLANT
COVER BACK TABLE 60X90IN (DRAPES) ×2 IMPLANT
CUFF TOURN SGL QUICK 24 (TOURNIQUET CUFF)
CUFF TOURN SGL QUICK 34 (TOURNIQUET CUFF)
CUFF TRNQT CYL 24X4X16.5-23 (TOURNIQUET CUFF) IMPLANT
CUFF TRNQT CYL 34X4.125X (TOURNIQUET CUFF) IMPLANT
DRAPE EXTREMITY T 121X128X90 (DISPOSABLE) ×2 IMPLANT
DRAPE OEC MINIVIEW 54X84 (DRAPES) ×2 IMPLANT
DRAPE U-SHAPE 47X51 STRL (DRAPES) ×2 IMPLANT
DRESSING MEPILEX FLEX 4X4 (GAUZE/BANDAGES/DRESSINGS) IMPLANT
DRIVER MICA HEX 2.0 (MISCELLANEOUS) IMPLANT
DRIVER MICA HEX 2.5 (MISCELLANEOUS) IMPLANT
DRSG MEPILEX FLEX 4X4 (GAUZE/BANDAGES/DRESSINGS) ×1
DRSG MEPITEL 4X7.2 (GAUZE/BANDAGES/DRESSINGS) ×2 IMPLANT
ELECT REM PT RETURN 9FT ADLT (ELECTROSURGICAL) ×1
ELECTRODE REM PT RTRN 9FT ADLT (ELECTROSURGICAL) ×2 IMPLANT
GAUGE DEPTH MICA PROSTEP 150 (MISCELLANEOUS) IMPLANT
GAUZE PAD ABD 8X10 STRL (GAUZE/BANDAGES/DRESSINGS) ×2 IMPLANT
GAUZE SPONGE 4X4 12PLY STRL (GAUZE/BANDAGES/DRESSINGS) ×2 IMPLANT
GAUZE STRETCH 2X75IN STRL (MISCELLANEOUS) IMPLANT
GLOVE BIO SURGEON STRL SZ8 (GLOVE) ×2 IMPLANT
GLOVE BIOGEL PI IND STRL 8 (GLOVE) ×4 IMPLANT
GLOVE ECLIPSE 8.0 STRL XLNG CF (GLOVE) ×2 IMPLANT
GOWN STRL REUS W/ TWL LRG LVL3 (GOWN DISPOSABLE) ×2 IMPLANT
GOWN STRL REUS W/ TWL XL LVL3 (GOWN DISPOSABLE) ×4 IMPLANT
GOWN STRL REUS W/TWL LRG LVL3 (GOWN DISPOSABLE) ×1
GOWN STRL REUS W/TWL XL LVL3 (GOWN DISPOSABLE) ×2
K-WIRE BLUNT TROCAR 1.4X150 (WIRE) ×1
K-WIRE BLUNT/TROCAR 0.9X150 (WIRE) ×2
K-WIRE DBL .054X9 NSTRL (WIRE) ×1
K-WIRE PROSTEP 1.4 (WIRE) ×1
KWIRE BLUNT TROCAR 1.4X150 (WIRE) IMPLANT
KWIRE BLUNT/TROCAR 0.9X150 (WIRE) IMPLANT
KWIRE DBL .054X9 NSTRL (WIRE) ×2 IMPLANT
KWIRE PROSTEP 1.4 (WIRE) IMPLANT
NDL HYPO 25X1 1.5 SAFETY (NEEDLE) IMPLANT
NEEDLE HYPO 22GX1.5 SAFETY (NEEDLE) IMPLANT
NEEDLE HYPO 25X1 1.5 SAFETY (NEEDLE) IMPLANT
NS IRRIG 1000ML POUR BTL (IV SOLUTION) ×2 IMPLANT
PACK BASIN DAY SURGERY FS (CUSTOM PROCEDURE TRAY) ×2 IMPLANT
PACK INSTRUMENT PROSTEP DISP (INSTRUMENTS) IMPLANT
PAD CAST 4YDX4 CTTN HI CHSV (CAST SUPPLIES) ×2 IMPLANT
PADDING CAST ABS COTTON 4X4 ST (CAST SUPPLIES) IMPLANT
PADDING CAST COTTON 4X4 STRL (CAST SUPPLIES) ×1
PADDING CAST COTTON 6X4 STRL (CAST SUPPLIES) IMPLANT
PENCIL SMOKE EVACUATOR (MISCELLANEOUS) ×2 IMPLANT
SANITIZER HAND PURELL 535ML FO (MISCELLANEOUS) ×2 IMPLANT
SCREW HCS TWIST-OFF 2.0X12MM (Screw) IMPLANT
SCREW MICA PROSTEP 3.0X22 (Screw) IMPLANT
SCREW MICA PROSTEP 3.0X32 (Screw) IMPLANT
SCREW MICA PROSTEP 4.0X50 (Screw) IMPLANT
SHEET MEDIUM DRAPE 40X70 STRL (DRAPES) ×2 IMPLANT
SLEEVE SCD COMPRESS KNEE MED (STOCKING) ×2 IMPLANT
SPLINT PLASTER CAST FAST 5X30 (CAST SUPPLIES) IMPLANT
SPONGE T-LAP 18X18 ~~LOC~~+RFID (SPONGE) ×2 IMPLANT
STOCKINETTE 6  STRL (DRAPES) ×1
STOCKINETTE 6 STRL (DRAPES) ×2 IMPLANT
SUCTION FRAZIER HANDLE 10FR (MISCELLANEOUS) ×2
SUCTION TUBE FRAZIER 10FR DISP (MISCELLANEOUS) ×2 IMPLANT
SUT ETHILON 3 0 PS 1 (SUTURE) ×2 IMPLANT
SUT MNCRL AB 3-0 PS2 18 (SUTURE) IMPLANT
SUT VIC AB 2-0 SH 27 (SUTURE)
SUT VIC AB 2-0 SH 27XBRD (SUTURE) IMPLANT
SUT VICRYL 0 SH 27 (SUTURE) IMPLANT
SUT VICRYL 0 UR6 27IN ABS (SUTURE) IMPLANT
SYR BULB EAR ULCER 3OZ GRN STR (SYRINGE) ×2 IMPLANT
SYR CONTROL 10ML LL (SYRINGE) IMPLANT
TOWEL GREEN STERILE FF (TOWEL DISPOSABLE) ×4 IMPLANT
TRANSLATOR PRSTEP MICA 1ST MET (MISCELLANEOUS) IMPLANT
TUBE CONNECTING 20X1/4 (TUBING) IMPLANT
TUBE SURG IRRIGATION (TUBING) IMPLANT
UNDERPAD 30X36 HEAVY ABSORB (UNDERPADS AND DIAPERS) ×2 IMPLANT
YANKAUER SUCT BULB TIP NO VENT (SUCTIONS) IMPLANT

## 2021-12-16 NOTE — Anesthesia Postprocedure Evaluation (Signed)
Anesthesia Post Note  Patient: Ashley Marks  Procedure(s) Performed: CORRECTION OF BUNION WITH MINIMALLY INVASIVE SURGERY WITH DOUBLE OSTEOTOMY (Left: Foot) SECOND WEIL OSTEOTOMY AND COLLATERAL LIGAMENT REPAIR (Left: Foot)     Patient location during evaluation: PACU Anesthesia Type: General Level of consciousness: sedated Pain management: pain level controlled Vital Signs Assessment: post-procedure vital signs reviewed and stable Respiratory status: spontaneous breathing and respiratory function stable Cardiovascular status: stable Postop Assessment: no apparent nausea or vomiting Anesthetic complications: no   No notable events documented.  Last Vitals:  Vitals:   12/16/21 1015 12/16/21 1025  BP: 126/82 121/72  Pulse: 88 87  Resp: 17 16  Temp:  (!) 36.2 C  SpO2: 95% 95%    Last Pain:  Vitals:   12/16/21 1025  PainSc: 0-No pain                 [name] DANIEL

## 2021-12-16 NOTE — Transfer of Care (Signed)
Immediate Anesthesia Transfer of Care Note  Patient: Anacarolina Lafontant  Procedure(s) Performed: CORRECTION OF BUNION WITH MINIMALLY INVASIVE SURGERY WITH DOUBLE OSTEOTOMY (Left: Foot) SECOND WEIL OSTEOTOMY AND COLLATERAL LIGAMENT REPAIR (Left: Foot)  Patient Location: PACU  Anesthesia Type:GA combined with regional for post-op pain  Level of Consciousness: drowsy and patient cooperative  Airway & Oxygen Therapy: Patient Spontanous Breathing and Patient connected to face mask oxygen  Post-op Assessment: Report given to RN and Post -op Vital signs reviewed and stable  Post vital signs: Reviewed and stable  Last Vitals:  Vitals Value Taken Time  BP    Temp    Pulse 78 12/16/21 0950  Resp    SpO2 97 % 12/16/21 0950  Vitals shown include unvalidated device data.  Last Pain:  Vitals:   12/16/21 0631  PainSc: 0-No pain      Patients Stated Pain Goal: 3 (59/29/24 4628)  Complications: No notable events documented.

## 2021-12-16 NOTE — Anesthesia Procedure Notes (Signed)
Procedure Name: LMA Insertion Date/Time: 12/16/2021 7:36 AM  Performed by: Nikkole Placzek, Ernesta Amble, CRNAPre-anesthesia Checklist: Patient identified, Emergency Drugs available, Suction available and Patient being monitored Patient Re-evaluated:Patient Re-evaluated prior to induction Oxygen Delivery Method: Circle system utilized Preoxygenation: Pre-oxygenation with 100% oxygen Induction Type: IV induction Ventilation: Mask ventilation without difficulty LMA: LMA inserted LMA Size: 4.0 Number of attempts: 1 Airway Equipment and Method: Bite block Placement Confirmation: positive ETCO2 Tube secured with: Tape Dental Injury: Teeth and Oropharynx as per pre-operative assessment

## 2021-12-16 NOTE — Discharge Instructions (Addendum)
Wylene Simmer, MD [facility]  Please read the following information regarding your care after surgery.  Medications  You only need a prescription for the narcotic pain medicine (ex. oxycodone, Percocet, Norco).  All of the other medicines listed below are available over the counter. ? Aleve 2 pills twice a day for the first 3 days after surgery. ? acetominophen (Tylenol) 650 mg every 4-6 hours as you need for minor to moderate pain ? oxycodone as prescribed for severe pain  Narcotic pain medicine (ex. oxycodone, Percocet, Vicodin) will cause constipation.  To prevent this problem, take the following medicines while you are taking any pain medicine. ? docusate sodium (Colace) 100 mg twice a day ? senna (Senokot) 2 tablets twice a day  Weight Bearing ? Bear weight only on your operated foot in the post-op shoe.   Cast / Splint / Dressing ? Keep your splint, cast or dressing clean and dry.  Don't put anything (coat hanger, pencil, etc) down inside of it.  If it gets damp, use a hair dryer on the cool setting to dry it.  If it gets soaked, call the office to schedule an appointment for a cast change.     After your dressing, cast or splint is removed; you may shower, but do not soak or scrub the wound.  Allow the water to run over it, and then gently pat it dry.  Swelling It is normal for you to have swelling where you had surgery.  To reduce swelling and pain, keep your toes above your nose for at least 3 days after surgery.  It may be necessary to keep your foot or leg elevated for several weeks.  If it hurts, it should be elevated.  Follow Up Call my office at [phone] when you are discharged from the hospital or surgery center to schedule an appointment to be seen two weeks after surgery.  Call my office at [phone] if you develop a fever >101.5 F, nausea, vomiting, bleeding from the surgical site or severe pain.      May take Tylenol after 12:30pm, if needed.    Post  Anesthesia Home Care Instructions  Activity: Get plenty of rest for the remainder of the day. A responsible individual must stay with you for 24 hours following the procedure.  For the next 24 hours, DO NOT: -Drive a car -Paediatric nurse -Drink alcoholic beverages -Take any medication unless instructed by your physician -Make any legal decisions or sign important papers.  Meals: Start with liquid foods such as gelatin or soup. Progress to regular foods as tolerated. Avoid greasy, spicy, heavy foods. If nausea and/or vomiting occur, drink only clear liquids until the nausea and/or vomiting subsides. Call your physician if vomiting continues.  Special Instructions/Symptoms: Your throat may feel dry or sore from the anesthesia or the breathing tube placed in your throat during surgery. If this causes discomfort, gargle with warm salt water. The discomfort should disappear within 24 hours.  If you had a scopolamine patch placed behind your ear for the management of post- operative nausea and/or vomiting:  1. The medication in the patch is effective for 72 hours, after which it should be removed.  Wrap patch in a tissue and discard in the trash. Wash hands thoroughly with soap and water. 2. You may remove the patch earlier than 72 hours if you experience unpleasant side effects which may include dry mouth, dizziness or visual disturbances. 3. Avoid touching the patch. Wash your hands with soap and water  after contact with the patch.    Regional Anesthesia Blocks  1. Numbness or the inability to move the "blocked" extremity may last from 3-48 hours after placement. The length of time depends on the medication injected and your individual response to the medication. If the numbness is not going away after 48 hours, call your surgeon.  2. The extremity that is blocked will need to be protected until the numbness is gone and the  Strength has returned. Because you cannot feel it, you will need to  take extra care to avoid injury. Because it may be weak, you may have difficulty moving it or using it. You may not know what position it is in without looking at it while the block is in effect.  3. For blocks in the legs and feet, returning to weight bearing and walking needs to be done carefully. You will need to wait until the numbness is entirely gone and the strength has returned. You should be able to move your leg and foot normally before you try and bear weight or walk. You will need someone to be with you when you first try to ensure you do not fall and possibly risk injury.  4. Bruising and tenderness at the needle site are common side effects and will resolve in a few days.  5. Persistent numbness or new problems with movement should be communicated to the surgeon or the [facility] [phone] [facility] [phone]).

## 2021-12-16 NOTE — Progress Notes (Signed)
Assisted Dr. Tobias Alexander with left, adductor canal, popliteal, ultrasound guided block. Side rails up, monitors on throughout procedure. See vital signs in flow sheet. Tolerated Procedure well.

## 2021-12-16 NOTE — Anesthesia Procedure Notes (Addendum)
Anesthesia Regional Block: Popliteal block   Pre-Anesthetic Checklist: , timeout performed,  Correct Patient, Correct Site, Correct Laterality,  Correct Procedure, Correct Position, site marked,  Risks and benefits discussed,  Surgical consent,  Pre-op evaluation,  At surgeon's request and post-op pain management  Laterality: Left  Prep: chloraprep       Needles:  Injection technique: Single-shot  Needle Type: Echogenic Stimulator Needle          Additional Needles:   Procedures:,,,, ultrasound used (permanent image in chart),,    Narrative:  Start time: 12/16/2021 6:53 AM End time: 12/16/2021 7:03 AM Injection made incrementally with aspirations every 5 mL.  Performed by: Personally  Anesthesiologist: Duane Boston, MD  Additional Notes: A functioning IV was confirmed and monitors were applied.  Sterile prep and drape, hand hygiene and sterile gloves were used.  Negative aspiration and test dose prior to incremental administration of local anesthetic. The patient tolerated the procedure well.Ultrasound  guidance: relevant anatomy identified, needle position confirmed, local anesthetic spread visualized around nerve(s), vascular puncture avoided.  Image printed for medical record. ACB supplement.

## 2021-12-16 NOTE — H&P (Signed)
Ashley Marks is an 61 y.o. female.   Chief Complaint: Left foot pain HPI: 61 year old female without significant past medical history complains of worsening left foot pain over the last couple of years.  She notes that her left foot bunion deformity has become quite prominent and limits her shoewear.  She also notes pain at the second MTP joint.  She has failed nonoperative treatment including activity modification, oral anti-inflammatories and shoewear modification.  She presents now for bunion correction and second metatarsal Weil osteotomy with collateral ligament repair.  Past Medical History:  Diagnosis Date   Bunion    Hypertension     Past Surgical History:  Procedure Laterality Date   RADIOACTIVE SEED GUIDED EXCISIONAL BREAST BIOPSY Right 08/06/2015   Procedure: RADIOACTIVE SEED GUIDED EXCISIONAL RIGHT BREAST BIOPSY;  Surgeon: [name], MD;  Location: [facility];  Service: General;  Laterality: Right;    Family History  Problem Relation Age of Onset   Heart disease Father    Dementia Mother    Breast cancer Maternal Grandmother 47   Breast cancer Cousin 75       mat first cousin   Colon cancer Neg Hx    Social History:  reports that she has never smoked. She has never used smokeless tobacco. She reports current alcohol use of about 2.0 standard drinks of alcohol per week. She reports that she does not use drugs.  Allergies: No Known Allergies  Medications Prior to Admission  Medication Sig Dispense Refill   Multiple Vitamin (MULTIVITAMIN) capsule Take 1 capsule by mouth daily.     telmisartan (MICARDIS) 80 MG tablet Take 80 mg by mouth daily.      No results found for this or any previous visit (from the past 48 hour(s)). DG MINI C-ARM IMAGE ONLY  Result Date: 01/07/22 There is no interpretation for this exam.  This order is for images obtained during a surgical procedure.  Please See "Surgeries" Tab for more information regarding the procedure.     Review of Systems no recent fever, chills, nausea, vomiting or changes in her appetite  Blood pressure 116/79, pulse 80, temperature 98.1 F (36.7 C), resp. rate 16, height '5\' 2"'$  (1.575 m), weight 66.3 kg, last menstrual period 04/30/2012, SpO2 97 %. Physical Exam  Well-nourished well-developed woman in no apparent distress.  Alert and oriented x4.  Normal mood and affect.  Extraocular motions are intact.  Respirations are unlabored.  Gait is normal.  The left foot has a prominent bunion deformity.  Skin is healthy and intact.  Pulses are palpable.  No lymphadenopathy.  Intact sensibility to light touch dorsally and plantarly at the forefoot.  Assessment/Plan Painful left foot bunion deformity, metatarsalgia and collateral ligament instability at the second MTP joint -to the operating room today for surgical treatment.  The risks and benefits of the alternative treatment options have been discussed in detail.  The patient wishes to proceed with surgery and specifically understands risks of bleeding, infection, nerve damage, blood clots, need for additional surgery, amputation and death.   Wylene Simmer, MD 07-Jan-2022, 7:24 AM

## 2021-12-16 NOTE — Op Note (Signed)
12/16/2021  10:04 AM  PATIENT:  Ashley Marks  61 y.o. female  PRE-OPERATIVE DIAGNOSIS: 1.  Left foot bunion deformity 2.  Left forefoot metatarsalgia 3.  Left second MTP joint collateral ligament instability  POST-OPERATIVE DIAGNOSIS: Same  Procedure(s): 1.  Correction of left foot bunion deformity with chevron and Akin osteotomies (MIS) 2.  Left second metatarsal Weil osteotomy 3.  Left second MTP joint medial collateral ligament repair 4.  Left foot AP, lateral and oblique radiographs  SURGEON:  Wylene Simmer, MD  ASSISTANT: Mechele Claude, PA-C  ANESTHESIA:   General, regional  EBL:  minimal   TOURNIQUET: None  COMPLICATIONS:  None apparent  DISPOSITION:  Extubated, awake and stable to recovery.  INDICATION FOR PROCEDURE: 61 year old female with a painful left foot bunion deformity presents today for bunion correction having failed nonoperative treatment.  She also has signs and symptoms of metatarsalgia and instability at the second MTP joint.The risks and benefits of the alternative treatment options have been discussed in detail.  The patient wishes to proceed with surgery and specifically understands risks of bleeding, infection, nerve damage, blood clots, need for additional surgery, amputation and death.   PROCEDURE IN DETAIL:  After pre operative consent was obtained, and the correct operative site was identified, the patient was brought to the operating room and placed supine on the OR table.  Anesthesia was administered.  Pre-operative antibiotics were administered.  A surgical timeout was taken.  The left lower extremity was prepped and draped in standard sterile fashion.  An AP radiograph was obtained.  An incision was marked on the skin after confirming the appropriate first metatarsal osteotomy site.  The incision was made and dissection carried subperiosteally at the metatarsal neck dorsally and medially.  The 2 x 20 mm Shannon bur was used to make a chevron osteotomy  in the neck of the metatarsal.  The osteotomy was then mobilized with an angled elevator.  The head of the metatarsal was translated laterally.  A guidepin was inserted from the dorsal medial base of the first metatarsal and advanced across to the proximal fragment lateral cortex.  It was passed through the lateral cortex and into the head of the metatarsal laterally.  A second guidepin was placed parallel to the first more distally on the first metatarsal shaft and into the medial head of the metatarsal.  Radiographs confirmed appropriate position of both guidepins.  The more lateral guide pin was measured after a small incision was made.  The guidepin was overdrilled and the cannulated screw inserted.  This was a Stryker 4 mm screw.  It was passed into the metatarsal head without difficulty and was noted to have excellent purchase.  The medial guidepin was removed and replaced with a 0.9 guidepin.  This was measured and overdrilled through a small incision.  The smaller cannulated screw was inserted and was noted to have excellent purchase in the first metatarsal shaft cortical bone and in the head of the metatarsal.  AP and lateral radiographs confirmed appropriate correction of the intermetatarsal angle.  The bur was used to smooth the overhanging bone at the medial cortex.  Attention was turned to the proximal phalanx of the hallux.  An incision was made on the skin at the midshaft.  Dissection was carried subperiosteally plantarly and dorsally.  The 2 x 12 mm Shannon bur was used to make a closing wedge osteotomy at the medial base metaphyseal flare.  The osteotomy was closed down and fixed with a 3 mm  cannulated screw after predrilling the guidewire.  Radiographs confirmed appropriate correction of the intermetatarsal and hallux valgus angles.  Attention was turned to the second MTP joint.  A longitudinal incision was made and dissection carried down through the subcutaneous tissues.  The dorsal joint  capsule was incised exposing the second metatarsal head.  Dorsal osteophytes were removed.  There was a dorsal lateral area of exposed bone.  A rotational osteotomy was then made with the 2 x 12 mm bur.  This removed all of the exposed bone and rotated cartilage dorsally to fill the arthritic area.  The osteotomy was fixed with a 2 mm Zimmer [employer] FRS screw.  The medial collateral ligament was then repaired with a box suture of 0 Vicryl.  Final AP, lateral and oblique radiographs confirmed appropriate position and length of all hardware and appropriate correction of the bunion deformity.  The wounds were irrigated copiously and sprinkled with vancomycin powder.  Skin incisions were all closed with simple and horizontal mattress sutures of 3-0 nylon.  Sterile dressings were applied followed by a bunion wrap.  The patient was awakened from anesthesia and transported to the recovery room in stable condition.   FOLLOW UP PLAN: Weightbearing as tolerated on the heel in a Darco shoe.  Follow-up in the office in 2 weeks for suture removal and conversion to a flat postop shoe.   RADIOGRAPHS: AP, lateral and oblique radiographs of the left foot are obtained intraoperatively.  These show interval correction of the bunion deformity and shortening of the second metatarsal.  Hardware is appropriately positioned and of the appropriate lengths.  No other acute injuries are noted.    Mechele Claude PA-C was present and scrubbed for the duration of the operative case. His assistance was essential in positioning the patient, prepping and draping, gaining and maintaining exposure, performing the operation, closing and dressing the wounds and applying the splint.

## 2021-12-17 ENCOUNTER — Encounter (HOSPITAL_BASED_OUTPATIENT_CLINIC_OR_DEPARTMENT_OTHER): Payer: Self-pay | Admitting: Orthopedic Surgery

## 2021-12-19 IMAGING — CT CT CARDIAC CORONARY ARTERY CALCIUM SCORE
3 series · 14 of 20 positions shown, 16 images · non-contrast
Comparison: None.

CLINICAL DATA: 58-year-old Caucasian female with family history of
heart disease. Nonsmoker.

EXAM:
CT CARDIAC CORONARY ARTERY CALCIUM SCORE
TECHNIQUE: Non-contrast imaging through the heart was performed using
prospective ECG gating. Image post processing was performed on an
independent workstation, allowing for quantitative analysis of the
heart and coronary arteries. Note that this exam targets the heart
and the chest was not imaged in its entirety.

[Series 2: calcium scoring 2.00 qr36 bestdiast 69% hrt calciu · axial · 0.35mm/px · z∈[+1599,+1683]mm · 4 of 70 slices shown]
[im 14/70  vessel]
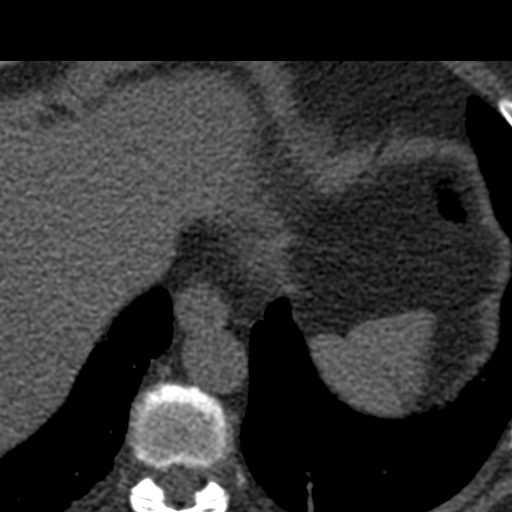
[im 28/70  vessel]
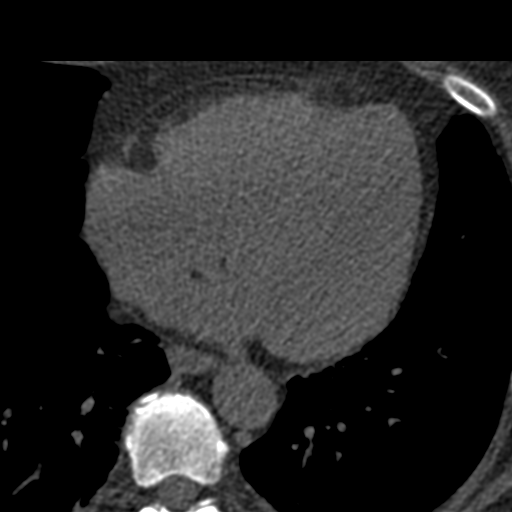
[im 42/70  vessel]
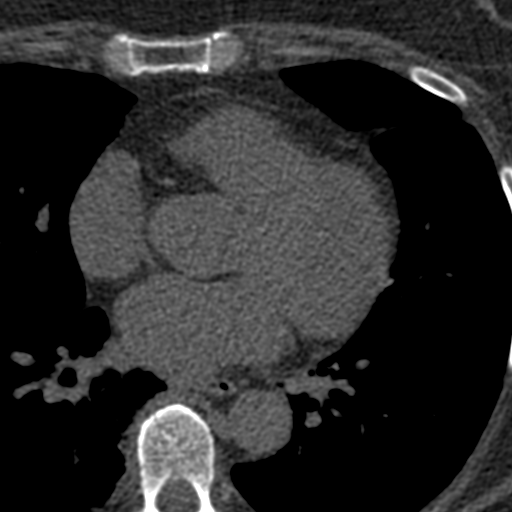
[im 56/70  vessel]
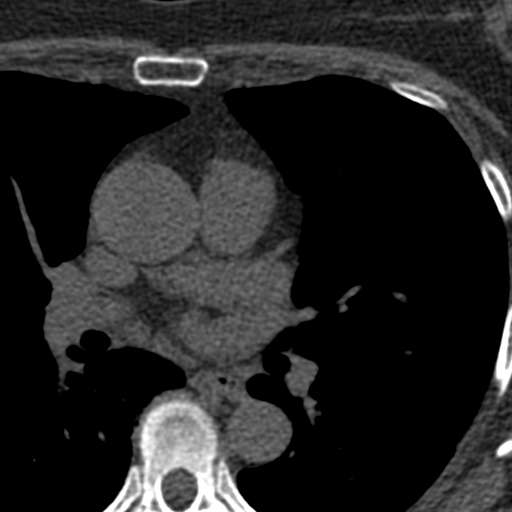

[Series 3: calcium scoring 2.00 br40 bestdiast 69% axial · axial · 0.53mm/px · z∈[+1595,+1687]mm · 5 of 70 slices shown, 7 images]
[im 12/70  vessel]
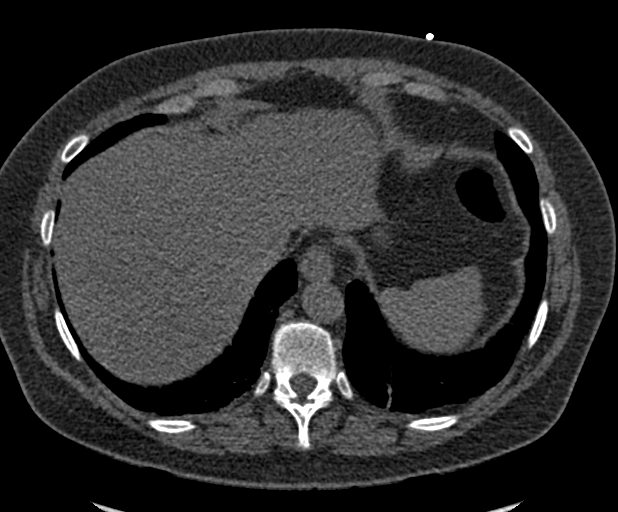
[im 12/70  lung]
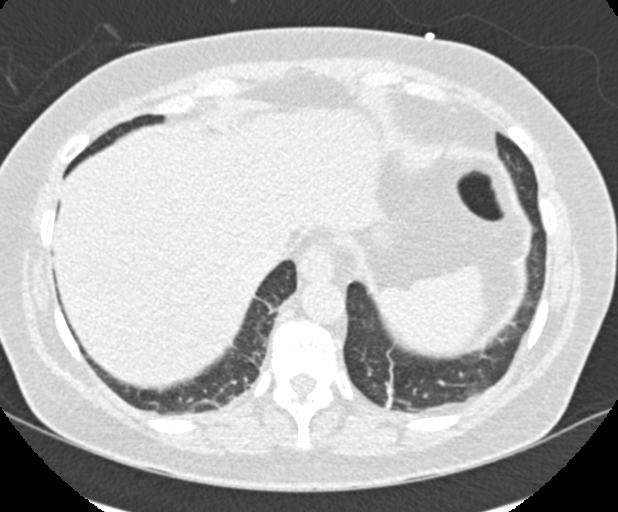
[im 24/70  vessel]
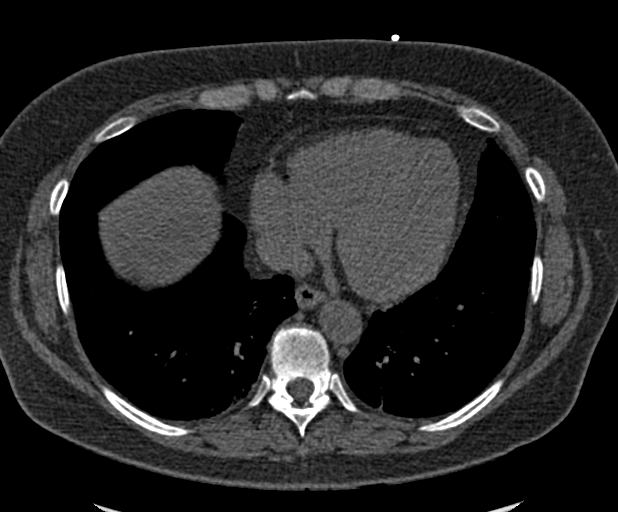
[im 35/70  vessel]
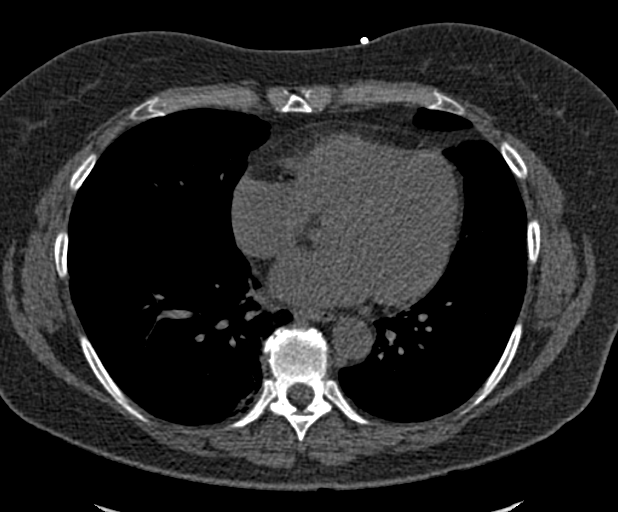
[im 47/70  vessel]
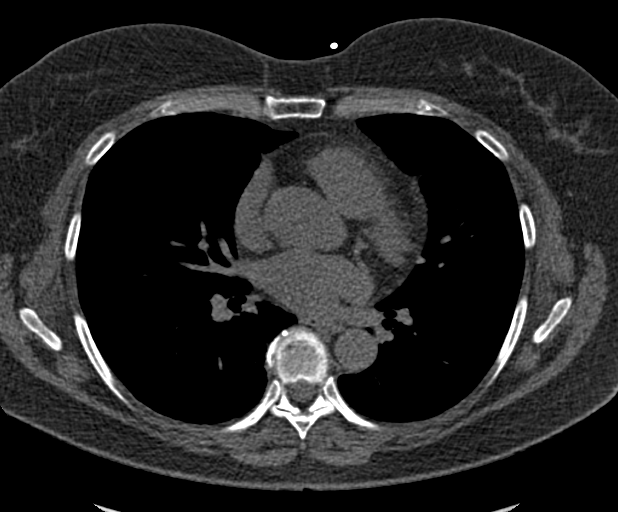
[im 58/70  vessel]
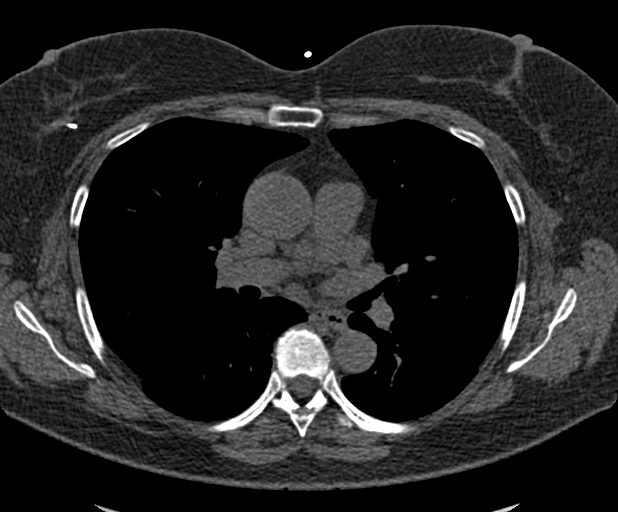
[im 58/70  lung]
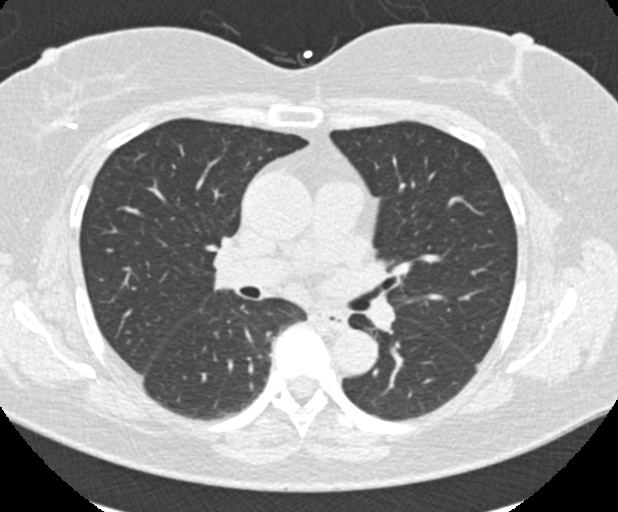

[Series 9: calcium scoring 2.00 br60 bestdiast 69% lungs · axial · 0.51mm/px · z∈[+1595,+1687]mm · 5 of 70 slices shown]
[im 12/70  vessel]
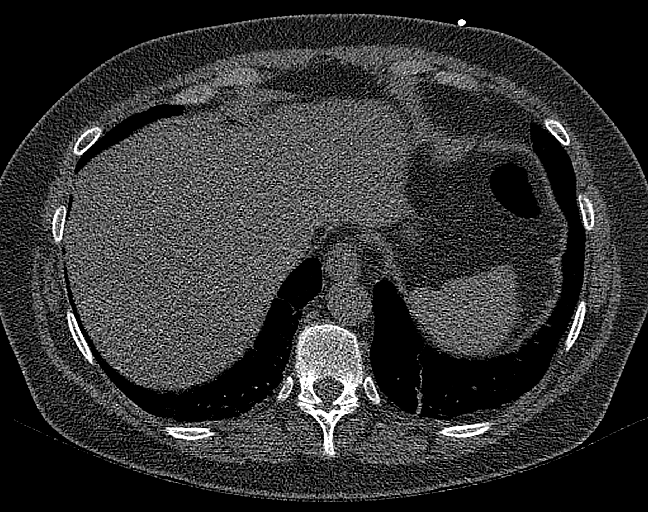
[im 24/70  vessel]
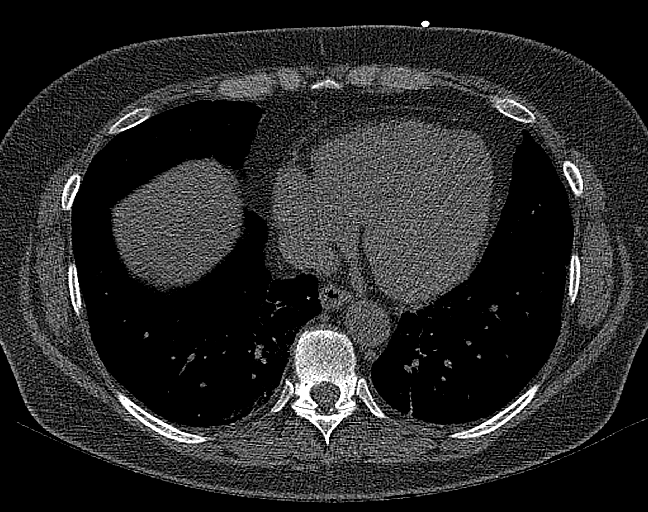
[im 35/70  vessel]
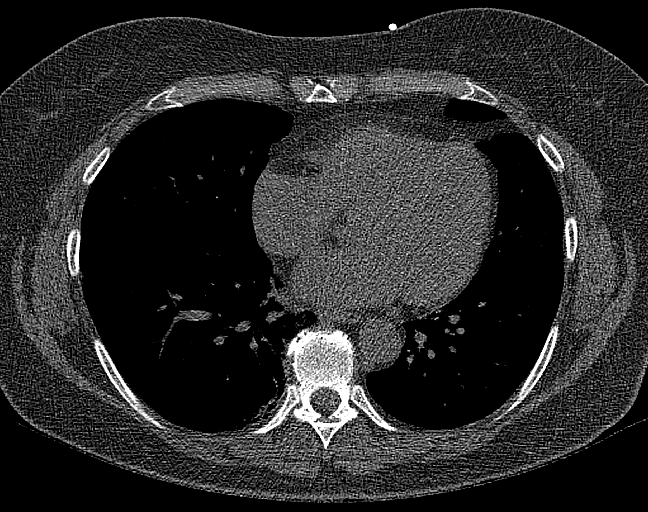
[im 47/70  vessel]
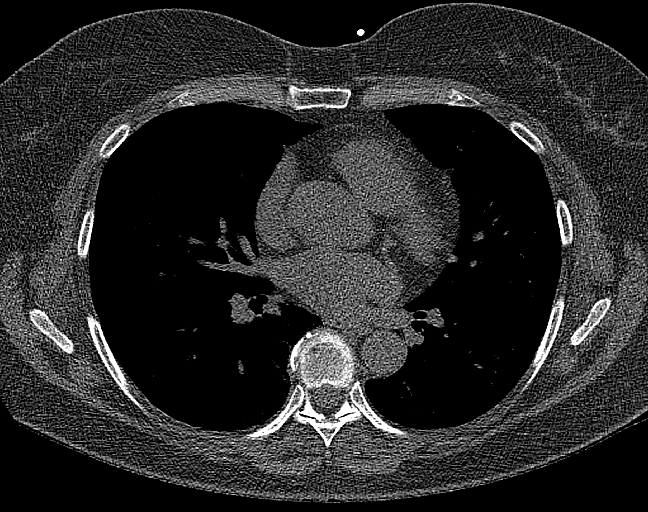
[im 58/70  vessel]
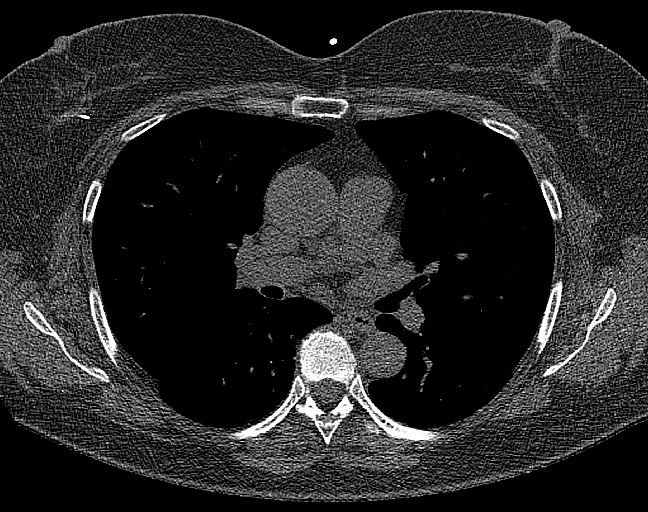

[14 of 20 positions shown; findings below may reference images not displayed]

FINDINGS: Technical quality: Good

CORONARY CALCIUM SCORES:

Left Main: No coronary artery calcification

LAD: No coronary calcification

LCx: No coronary calcification

RCA: No coronary calcification

CORONARY CALCIUM

Total Agatston Score: 0

[HOSPITAL] percentile: 0

Ascending aorta (normal <  40 mm): 34 mm

EXTRACARDIAC FINDINGS:

Limited view of the lung parenchyma demonstrates no suspicious
nodularity. Airways are normal.

Limited view of the mediastinum demonstrates no adenopathy.
Esophagus normal.

Limited view of the upper abdomen unremarkable.

Limited view of the skeleton and chest wall is unremarkable.
IMPRESSION: 1. No coronary artery calcification.

2. Total Agatston Score: 0

3. MESA age and sex matched database percentile: 0

## 2021-12-23 DIAGNOSIS — R82998 Other abnormal findings in urine: Secondary | ICD-10-CM | POA: Diagnosis not present

## 2022-01-26 DIAGNOSIS — Z01419 Encounter for gynecological examination (general) (routine) without abnormal findings: Secondary | ICD-10-CM | POA: Diagnosis not present

## 2022-01-26 DIAGNOSIS — Z1231 Encounter for screening mammogram for malignant neoplasm of breast: Secondary | ICD-10-CM | POA: Diagnosis not present

## 2022-01-26 DIAGNOSIS — Z6825 Body mass index (BMI) 25.0-25.9, adult: Secondary | ICD-10-CM | POA: Diagnosis not present

## 2022-01-26 DIAGNOSIS — Z1151 Encounter for screening for human papillomavirus (HPV): Secondary | ICD-10-CM | POA: Diagnosis not present

## 2022-01-26 DIAGNOSIS — Z124 Encounter for screening for malignant neoplasm of cervix: Secondary | ICD-10-CM | POA: Diagnosis not present

## 2022-02-03 DIAGNOSIS — M21612 Bunion of left foot: Secondary | ICD-10-CM | POA: Diagnosis not present

## 2022-03-09 DIAGNOSIS — M7742 Metatarsalgia, left foot: Secondary | ICD-10-CM | POA: Diagnosis not present

## 2022-06-09 DIAGNOSIS — M65331 Trigger finger, right middle finger: Secondary | ICD-10-CM | POA: Diagnosis not present

## 2022-06-09 DIAGNOSIS — M65341 Trigger finger, right ring finger: Secondary | ICD-10-CM | POA: Diagnosis not present

## 2022-06-14 ENCOUNTER — Encounter: Payer: Self-pay | Admitting: Internal Medicine

## 2022-07-01 ENCOUNTER — Ambulatory Visit (AMBULATORY_SURGERY_CENTER): Payer: BC Managed Care – PPO

## 2022-07-01 VITALS — Ht 62.0 in | Wt 140.0 lb

## 2022-07-01 DIAGNOSIS — Z86010 Personal history of colonic polyps: Secondary | ICD-10-CM

## 2022-07-01 MED ORDER — NA SULFATE-K SULFATE-MG SULF 17.5-3.13-1.6 GM/177ML PO SOLN: 1.0000 | Freq: Once | ORAL | 0 refills | Status: AC

## 2022-07-01 NOTE — Progress Notes (Signed)
No egg or soy allergy known to patient  No issues known to pt with past sedation with any surgeries or procedures Patient denies ever being told they had issues or difficulty with intubation   No FH of Malignant Hyperthermia Pt is not on diet pills Pt is not on  home 02  Pt is not on blood thinners  Pt denies issues with constipation  No A fib or A flutter Have any cardiac testing pending--no Pt denies any issues with mobility   Pt instructed to use [employer] or GoodRx for a price reduction on prep    PV complete. Prep instructions reviewed with patient. Instructions sent via mychart and to address on file.   Rx sent to [facility] as requested

## 2022-07-06 ENCOUNTER — Encounter: Payer: Self-pay | Admitting: Internal Medicine

## 2022-07-18 ENCOUNTER — Encounter: Payer: Self-pay | Admitting: Internal Medicine

## 2022-07-18 ENCOUNTER — Telehealth: Payer: Self-pay | Admitting: Internal Medicine

## 2022-07-18 ENCOUNTER — Encounter: Payer: BC Managed Care – PPO | Admitting: Internal Medicine

## 2022-07-18 NOTE — Telephone Encounter (Signed)
FYI

## 2022-07-18 NOTE — Telephone Encounter (Signed)
OK  No charge due to illness

## 2022-07-18 NOTE — Telephone Encounter (Signed)
Inbound call from patient, wishing to reschedule procedure for today. Patient states she had been sick all weekend. Patient rescheduled for 6/19 at 8:00 AM.

## 2022-08-04 DIAGNOSIS — M7742 Metatarsalgia, left foot: Secondary | ICD-10-CM | POA: Diagnosis not present

## 2022-08-04 DIAGNOSIS — M21612 Bunion of left foot: Secondary | ICD-10-CM | POA: Diagnosis not present

## 2022-08-10 ENCOUNTER — Telehealth: Payer: Self-pay

## 2022-08-10 NOTE — Telephone Encounter (Signed)
Patient needs updated prep instructions. Please advise.,

## 2022-08-10 NOTE — Telephone Encounter (Signed)
Updated new instruction with new times an dates.  Sent in My Chart and through the mail

## 2022-08-10 NOTE — Telephone Encounter (Signed)
Patient changed colonoscopy date.  New prep instructions sent to My Chart and sent through mail.

## 2022-08-14 ENCOUNTER — Encounter: Payer: Self-pay | Admitting: Certified Registered Nurse Anesthetist

## 2022-08-17 ENCOUNTER — Ambulatory Visit (AMBULATORY_SURGERY_CENTER): Payer: BC Managed Care – PPO | Admitting: Internal Medicine

## 2022-08-17 ENCOUNTER — Encounter: Payer: Self-pay | Admitting: Internal Medicine

## 2022-08-17 VITALS — BP 122/75 | HR 65 | Temp 97.8°F | Resp 16 | Ht 62.0 in | Wt 140.0 lb

## 2022-08-17 DIAGNOSIS — D123 Benign neoplasm of transverse colon: Secondary | ICD-10-CM

## 2022-08-17 DIAGNOSIS — Z1211 Encounter for screening for malignant neoplasm of colon: Secondary | ICD-10-CM

## 2022-08-17 DIAGNOSIS — D3615 Benign neoplasm of peripheral nerves and autonomic nervous system of abdomen: Secondary | ICD-10-CM | POA: Diagnosis not present

## 2022-08-17 DIAGNOSIS — D124 Benign neoplasm of descending colon: Secondary | ICD-10-CM | POA: Diagnosis not present

## 2022-08-17 DIAGNOSIS — K635 Polyp of colon: Secondary | ICD-10-CM | POA: Diagnosis not present

## 2022-08-17 MED ORDER — SODIUM CHLORIDE 0.9 % IV SOLN: 500.0000 mL | Freq: Once | INTRAVENOUS | Status: DC

## 2022-08-17 NOTE — Progress Notes (Signed)
Called to room to assist during endoscopic procedure.  Patient ID and intended procedure confirmed with present staff. Received instructions for my participation in the procedure from the performing physician.  

## 2022-08-17 NOTE — Progress Notes (Signed)
Report given to PACU, vss 

## 2022-08-17 NOTE — Patient Instructions (Addendum)
I found and removed 3 polyps today.  You also have a condition called diverticulosis - common and not usually a problem. Please read the handout provided.  I will let you know pathology results and when to have another routine colonoscopy by mail and/or My Chart.  I appreciate the opportunity to care for you. Iva Boop, MD, FACG   YOU HAD AN ENDOSCOPIC PROCEDURE TODAY AT THE [facility]:   Refer to the procedure report that was given to you for any specific questions about what was found during the examination.  If the procedure report does not answer your questions, please call your gastroenterologist to clarify.  If you requested that your care partner not be given the details of your procedure findings, then the procedure report has been included in a sealed envelope for you to review at your convenience later.  YOU SHOULD EXPECT: Some feelings of bloating in the abdomen. Passage of more gas than usual.  Walking can help get rid of the air that was put into your GI tract during the procedure and reduce the bloating. If you had a lower endoscopy (such as a colonoscopy or flexible sigmoidoscopy) you may notice spotting of blood in your stool or on the toilet paper. If you underwent a bowel prep for your procedure, you may not have a normal bowel movement for a few days.  Please Note:  You might notice some irritation and congestion in your nose or some drainage.  This is from the oxygen used during your procedure.  There is no need for concern and it should clear up in a day or so.  SYMPTOMS TO REPORT IMMEDIATELY:  Following lower endoscopy (colonoscopy or flexible sigmoidoscopy):  Excessive amounts of blood in the stool  Significant tenderness or worsening of abdominal pains  Swelling of the abdomen that is new, acute  Fever of 100F or higher  For urgent or emergent issues, a gastroenterologist can be reached at any hour by calling [phone]. Do not use MyChart  messaging for urgent concerns.    DIET:  We do recommend a small meal at first, but then you may proceed to your regular diet.  Drink plenty of fluids but you should avoid alcoholic beverages for 24 hours.  ACTIVITY:  You should plan to take it easy for the rest of today and you should NOT DRIVE or use heavy machinery until tomorrow (because of the sedation medicines used during the test).    FOLLOW UP: Our staff will call the number listed on your records the next business day following your procedure.  We will call around 7:15- 8:00 am to check on you and address any questions or concerns that you may have regarding the information given to you following your procedure. If we do not reach you, we will leave a message.     If any biopsies were taken you will be contacted by phone or by letter within the next 1-3 weeks.  Please call us at [phone] if you have not heard about the biopsies in 3 weeks.    SIGNATURES/CONFIDENTIALITY: You and/or your care partner have signed paperwork which will be entered into your electronic medical record.  These signatures attest to the fact that that the information above on your After Visit Summary has been reviewed and is understood.  Full responsibility of the confidentiality of this discharge information lies with you and/or your care-partner.

## 2022-08-17 NOTE — Progress Notes (Signed)
[facility] History and Physical   Primary Care Physician:  Rodrigo Ran, MD   Reason for Procedure:   CRCA screening  Plan:    colonoscopy     HPI: Ashley Marks is a 62 y.o. female w/ screening colonoscopy 2014 negative for neoplasia (2 mm mucosal polyp)   Past Medical History:  Diagnosis Date   Bunion    Hypertension     Past Surgical History:  Procedure Laterality Date   BUNIONECTOMY Left 12/16/2021   Procedure: CORRECTION OF BUNION WITH MINIMALLY INVASIVE SURGERY WITH DOUBLE OSTEOTOMY;  Surgeon: Toni Arthurs, MD;  Location: [facility];  Service: Orthopedics;  Laterality: Left;   COLONOSCOPY     RADIOACTIVE SEED GUIDED EXCISIONAL BREAST BIOPSY Right 08/06/2015   Procedure: RADIOACTIVE SEED GUIDED EXCISIONAL RIGHT BREAST BIOPSY;  Surgeon: Almond Lint, MD;  Location: [facility];  Service: General;  Laterality: Right;   WEIL OSTEOTOMY Left 12/16/2021   Procedure: SECOND WEIL OSTEOTOMY AND COLLATERAL LIGAMENT REPAIR;  Surgeon: Toni Arthurs, MD;  Location: [facility];  Service: Orthopedics;  Laterality: Left;    Prior to Admission medications   Medication Sig Start Date End Date Taking? Authorizing Provider  Multiple Vitamin (MULTIVITAMIN) capsule Take 1 capsule by mouth daily.   Yes [provider]  telmisartan (MICARDIS) 80 MG tablet Take 80 mg by mouth daily.   Yes [provider]    Current Outpatient Medications  Medication Sig Dispense Refill   Multiple Vitamin (MULTIVITAMIN) capsule Take 1 capsule by mouth daily.     telmisartan (MICARDIS) 80 MG tablet Take 80 mg by mouth daily.     Current Facility-Administered Medications  Medication Dose Route Frequency Provider Last Rate Last Admin   0.9 %  sodium chloride infusion  500 mL Intravenous Once Iva Boop, MD        Allergies as of 08/17/2022   (No Known Allergies)    Family History  Problem Relation Age of Onset   Dementia  Mother    Heart disease Father    Breast cancer Maternal Grandmother 2   Breast cancer Cousin 50       mat first cousin   Colon cancer Neg Hx    Colon polyps Neg Hx    Rectal cancer Neg Hx    Stomach cancer Neg Hx     Social History   Socioeconomic History   Marital status: Married    Spouse name: Caryn Bee   Number of children: Not on file   Years of education: Not on file   Highest education level: Not on file  Occupational History   Not on file  Tobacco Use   Smoking status: Never   Smokeless tobacco: Never  Vaping Use   Vaping Use: Never used  Substance and Sexual Activity   Alcohol use: Yes    Alcohol/week: 2.0 standard drinks of alcohol    Types: 2 Standard drinks or equivalent per week   Drug use: No   Sexual activity: Yes    Birth control/protection: Surgical    Comment: husband  Other Topics Concern   Not on file  Social History Narrative   Not on file   Social Determinants of Health   Financial Resource Strain: Not on file  Food Insecurity: Not on file  Transportation Needs: Not on file  Physical Activity: Not on file  Stress: Not on file  Social Connections: Not on file  Intimate Partner Violence: Not on file    Review of  Systems:  All other review of systems negative except as mentioned in the HPI.  Physical Exam: Vital signs BP (!) 145/88   Pulse 77   Temp 97.8 F (36.6 C) (Temporal)   Resp 14   Ht 5\' 2"  (1.575 m)   Wt 140 lb (63.5 kg)   LMP 04/30/2012   SpO2 97%   BMI 25.61 kg/m   General:   Alert,  Well-developed, well-nourished, pleasant and cooperative in NAD Lungs:  Clear throughout to auscultation.   Heart:  Regular rate and rhythm; no murmurs, clicks, rubs,  or gallops. Abdomen:  Soft, nontender and nondistended. Normal bowel sounds.   Neuro/Psych:  Alert and cooperative. Normal mood and affect. A and O x 3   @Lorey Pallett  Sena Slate, MD, [facility] [phone] (pager) 08/17/2022 8:02 AM@

## 2022-08-17 NOTE — Op Note (Signed)
[facility] Patient Name: Ashley Marks Procedure Date: 08/17/2022 7:18 AM MRN: [id number] Endoscopist: Iva Boop , MD, [id number] Age: 62 Referring MD:  Date of Birth: 12-22-1960 Gender: Female Account #: [id number] Procedure:                Colonoscopy Indications:              Screening for colorectal malignant neoplasm, Last                            colonoscopy: 2014 Medicines:                Monitored Anesthesia Care Procedure:                Pre-Anesthesia Assessment:                           - Prior to the procedure, a History and Physical                            was performed, and patient medications and                            allergies were reviewed. The patient's tolerance of                            previous anesthesia was also reviewed. The risks                            and benefits of the procedure and the sedation                            options and risks were discussed with the patient.                            All questions were answered, and informed consent                            was obtained. Prior Anticoagulants: The patient has                            taken no anticoagulant or antiplatelet agents. ASA                            Grade Assessment: II - A patient with mild systemic                            disease. After reviewing the risks and benefits,                            the patient was deemed in satisfactory condition to                            undergo the procedure.  After obtaining informed consent, the colonoscope                            was passed under direct vision. Throughout the                            procedure, the patient's blood pressure, pulse, and                            oxygen saturations were monitored continuously. The                            Olympus CF-HQ190L [id number]) Colonoscope was                            introduced through the anus and advanced to  the the                            cecum, identified by appendiceal orifice and                            ileocecal valve. The colonoscopy was performed                            without difficulty. The patient tolerated the                            procedure well. The quality of the bowel                            preparation was excellent. The bowel preparation                            used was SUPREP via split dose instruction. The                            ileocecal valve, appendiceal orifice, and rectum                            were photographed. Scope In: 8:07:59 AM Scope Out: 8:24:16 AM Scope Withdrawal Time: 0 hours 12 minutes 49 seconds  Total Procedure Duration: 0 hours 16 minutes 17 seconds  Findings:                 The perianal and digital rectal examinations were                            normal.                           Three sessile polyps were found in the descending                            colon and transverse colon. The polyps were 2 to 15  mm in size. These polyps were removed with a cold                            snare. 15 mm polyp removed piecemeal (bottle 1)                            Resection and retrieval were complete. Verification                            of patient identification for the specimen was                            done. Estimated blood loss was minimal.                           Multiple diverticula were found in the sigmoid                            colon.                           The exam was otherwise without abnormality on                            direct and retroflexion views. Complications:            No immediate complications. Estimated Blood Loss:     Estimated blood loss was minimal. Impression:               - Three 2 to 15 mm polyps in the descending colon                            and in the transverse colon, removed with a cold                            snare. Resected and  retrieved.                           - Diverticulosis in the sigmoid colon.                           - The examination was otherwise normal on direct                            and retroflexion views. Recommendation:           - Patient has a contact number available for                            emergencies. The signs and symptoms of potential                            delayed complications were discussed with the  patient. Return to normal activities tomorrow.                            Written discharge instructions were provided to the                            patient.                           - Resume previous diet.                           - Continue present medications.                           - Await pathology results.                           - Repeat colonoscopy is recommended. The                            colonoscopy date will be determined after pathology                            results from today's exam become available for                            review. Iva Boop, MD 08/17/2022 8:31:42 AM This report has been signed electronically.

## 2022-08-18 ENCOUNTER — Telehealth: Payer: Self-pay | Admitting: *Deleted

## 2022-08-18 NOTE — Telephone Encounter (Signed)
Attempted to call patient for their post-procedure follow-up call. No answer. Left voicemail.   

## 2022-08-26 ENCOUNTER — Encounter: Payer: Self-pay | Admitting: Internal Medicine

## 2022-08-26 DIAGNOSIS — Z860100 Personal history of colon polyps, unspecified: Secondary | ICD-10-CM | POA: Insufficient documentation

## 2022-08-26 HISTORY — DX: Personal history of colon polyps, unspecified: Z86.0100

## 2023-01-11 DIAGNOSIS — I10 Essential (primary) hypertension: Secondary | ICD-10-CM | POA: Diagnosis not present

## 2023-01-12 DIAGNOSIS — R82998 Other abnormal findings in urine: Secondary | ICD-10-CM | POA: Diagnosis not present

## 2023-01-12 DIAGNOSIS — Z23 Encounter for immunization: Secondary | ICD-10-CM | POA: Diagnosis not present

## 2023-01-12 DIAGNOSIS — H6122 Impacted cerumen, left ear: Secondary | ICD-10-CM | POA: Diagnosis not present

## 2023-01-12 DIAGNOSIS — Z0000 Encounter for general adult medical examination without abnormal findings: Secondary | ICD-10-CM | POA: Diagnosis not present

## 2023-01-12 DIAGNOSIS — I10 Essential (primary) hypertension: Secondary | ICD-10-CM | POA: Diagnosis not present

## 2023-02-02 DIAGNOSIS — M65331 Trigger finger, right middle finger: Secondary | ICD-10-CM | POA: Diagnosis not present

## 2023-02-02 DIAGNOSIS — M65341 Trigger finger, right ring finger: Secondary | ICD-10-CM | POA: Diagnosis not present

## 2023-04-26 DIAGNOSIS — Z1231 Encounter for screening mammogram for malignant neoplasm of breast: Secondary | ICD-10-CM | POA: Diagnosis not present

## 2023-04-26 DIAGNOSIS — Z01419 Encounter for gynecological examination (general) (routine) without abnormal findings: Secondary | ICD-10-CM | POA: Diagnosis not present

## 2023-04-26 DIAGNOSIS — Z6826 Body mass index (BMI) 26.0-26.9, adult: Secondary | ICD-10-CM | POA: Diagnosis not present

## 2023-08-08 ENCOUNTER — Ambulatory Visit: Admitting: Podiatry

## 2023-08-09 DIAGNOSIS — Z13820 Encounter for screening for osteoporosis: Secondary | ICD-10-CM | POA: Diagnosis not present

## 2023-08-31 DIAGNOSIS — M180 Bilateral primary osteoarthritis of first carpometacarpal joints: Secondary | ICD-10-CM | POA: Diagnosis not present

## 2023-08-31 DIAGNOSIS — M65331 Trigger finger, right middle finger: Secondary | ICD-10-CM | POA: Diagnosis not present

## 2023-08-31 DIAGNOSIS — M65341 Trigger finger, right ring finger: Secondary | ICD-10-CM | POA: Diagnosis not present

## 2023-11-24 DIAGNOSIS — L710 Perioral dermatitis: Secondary | ICD-10-CM | POA: Diagnosis not present
